# Patient Record
Sex: Female | Born: 1955 | Race: Black or African American | Hispanic: No | Smoking: Current every day smoker
Health system: Southern US, Community
[De-identification: ages and names within clinical notes are randomized; demographics above are authoritative.]

## PROBLEM LIST (undated history)

## (undated) DIAGNOSIS — F1010 Alcohol abuse, uncomplicated: Secondary | ICD-10-CM

## (undated) DIAGNOSIS — I10 Essential (primary) hypertension: Secondary | ICD-10-CM

## (undated) DIAGNOSIS — F319 Bipolar disorder, unspecified: Secondary | ICD-10-CM

## (undated) DIAGNOSIS — D649 Anemia, unspecified: Secondary | ICD-10-CM

## (undated) DIAGNOSIS — E7800 Pure hypercholesterolemia, unspecified: Secondary | ICD-10-CM

## (undated) DIAGNOSIS — M810 Age-related osteoporosis without current pathological fracture: Secondary | ICD-10-CM

## (undated) DIAGNOSIS — E119 Type 2 diabetes mellitus without complications: Secondary | ICD-10-CM

---

## 2016-02-06 ENCOUNTER — Emergency Department (HOSPITAL_BASED_OUTPATIENT_CLINIC_OR_DEPARTMENT_OTHER): Payer: Medicare HMO

## 2016-02-06 ENCOUNTER — Encounter (HOSPITAL_BASED_OUTPATIENT_CLINIC_OR_DEPARTMENT_OTHER): Payer: Self-pay | Admitting: Emergency Medicine

## 2016-02-06 ENCOUNTER — Other Ambulatory Visit: Payer: Self-pay

## 2016-02-06 ENCOUNTER — Emergency Department (HOSPITAL_BASED_OUTPATIENT_CLINIC_OR_DEPARTMENT_OTHER)
Admission: EM | Admit: 2016-02-06 | Discharge: 2016-02-06 | Disposition: A | Discharge status: Home / Self Care | Payer: Medicare HMO | Attending: Emergency Medicine | Admitting: Emergency Medicine

## 2016-02-06 DIAGNOSIS — Z7982 Long term (current) use of aspirin: Secondary | ICD-10-CM | POA: Diagnosis not present

## 2016-02-06 DIAGNOSIS — F17200 Nicotine dependence, unspecified, uncomplicated: Secondary | ICD-10-CM | POA: Insufficient documentation

## 2016-02-06 DIAGNOSIS — R51 Headache: Secondary | ICD-10-CM | POA: Diagnosis present

## 2016-02-06 DIAGNOSIS — R519 Headache, unspecified: Secondary | ICD-10-CM

## 2016-02-06 DIAGNOSIS — Z7984 Long term (current) use of oral hypoglycemic drugs: Secondary | ICD-10-CM | POA: Insufficient documentation

## 2016-02-06 DIAGNOSIS — Z79899 Other long term (current) drug therapy: Secondary | ICD-10-CM | POA: Diagnosis not present

## 2016-02-06 DIAGNOSIS — I10 Essential (primary) hypertension: Secondary | ICD-10-CM | POA: Diagnosis not present

## 2016-02-06 DIAGNOSIS — E119 Type 2 diabetes mellitus without complications: Secondary | ICD-10-CM | POA: Insufficient documentation

## 2016-02-06 DIAGNOSIS — R079 Chest pain, unspecified: Secondary | ICD-10-CM | POA: Diagnosis not present

## 2016-02-06 DIAGNOSIS — F319 Bipolar disorder, unspecified: Secondary | ICD-10-CM | POA: Insufficient documentation

## 2016-02-06 DIAGNOSIS — M542 Cervicalgia: Secondary | ICD-10-CM | POA: Diagnosis not present

## 2016-02-06 HISTORY — DX: Essential (primary) hypertension: I10

## 2016-02-06 HISTORY — DX: Bipolar disorder, unspecified: F31.9

## 2016-02-06 HISTORY — DX: Type 2 diabetes mellitus without complications: E11.9

## 2016-02-06 HISTORY — DX: Anemia, unspecified: D64.9

## 2016-02-06 HISTORY — DX: Pure hypercholesterolemia, unspecified: E78.00

## 2016-02-06 HISTORY — DX: Age-related osteoporosis without current pathological fracture: M81.0

## 2016-02-06 LAB — BASIC METABOLIC PANEL
ANION GAP: 11 (ref 5–15)
BUN: 7 mg/dL (ref 6–20)
CHLORIDE: 97 mmol/L — AB (ref 101–111)
CO2: 31 mmol/L (ref 22–32)
Calcium: 9.6 mg/dL (ref 8.9–10.3)
Creatinine, Ser: 0.66 mg/dL (ref 0.44–1.00)
GFR calc Af Amer: >60 mL/min (ref >60)
GLUCOSE: 104 mg/dL — AB (ref 65–99)
POTASSIUM: 2.3 mmol/L — AB (ref 3.5–5.1)
Sodium: 139 mmol/L (ref 135–145)

## 2016-02-06 LAB — CBC WITH DIFFERENTIAL/PLATELET
BASOS ABS: 0 10*3/uL (ref 0.0–0.1)
BASOS PCT: 0 %
EOS PCT: 2 %
Eosinophils Absolute: 0.1 10*3/uL (ref 0.0–0.7)
HCT: 41.7 % (ref 36.0–46.0)
Hemoglobin: 14.6 g/dL (ref 12.0–15.0)
Lymphocytes Relative: 26 %
Lymphs Abs: 1.7 10*3/uL (ref 0.7–4.0)
MCH: 37.2 pg — ABNORMAL HIGH (ref 26.0–34.0)
MCHC: 35 g/dL (ref 30.0–36.0)
MCV: 106.1 fL — ABNORMAL HIGH (ref 78.0–100.0)
MONO ABS: 0.7 10*3/uL (ref 0.1–1.0)
Monocytes Relative: 11 %
Neutro Abs: 4 10*3/uL (ref 1.7–7.7)
Neutrophils Relative %: 61 %
PLATELETS: 138 10*3/uL — AB (ref 150–400)
RBC: 3.93 MIL/uL (ref 3.87–5.11)
RDW: 12.4 % (ref 11.5–15.5)
WBC: 6.6 10*3/uL (ref 4.0–10.5)

## 2016-02-06 LAB — TROPONIN I

## 2016-02-06 MED ORDER — OXYCODONE-ACETAMINOPHEN 5-325 MG PO TABS
1.0000 | ORAL_TABLET | Freq: Once | ORAL | Status: AC
  Administered 2016-02-06: 1 via ORAL
  Filled 2016-02-06: qty 1

## 2016-02-06 MED ORDER — ONDANSETRON HCL 4 MG/2ML IJ SOLN
4.0000 mg | Freq: Once | INTRAMUSCULAR | Status: AC
  Administered 2016-02-06: 4 mg via INTRAVENOUS
  Filled 2016-02-06: qty 2

## 2016-02-06 MED ORDER — POTASSIUM CHLORIDE ER 10 MEQ PO TBCR: 10.0000 meq | EXTENDED_RELEASE_TABLET | Freq: Two times a day (BID) | ORAL | Status: DC

## 2016-02-06 MED ORDER — SODIUM CHLORIDE 0.9 % IV BOLUS (SEPSIS)
1000.0000 mL | Freq: Once | INTRAVENOUS | Status: AC
  Administered 2016-02-06: 1000 mL via INTRAVENOUS

## 2016-02-06 MED ORDER — METHOCARBAMOL 500 MG PO TABS
500.0000 mg | ORAL_TABLET | Freq: Once | ORAL | Status: AC
  Administered 2016-02-06: 500 mg via ORAL
  Filled 2016-02-06: qty 1

## 2016-02-06 MED ORDER — POTASSIUM CHLORIDE 10 MEQ/100ML IV SOLN
10.0000 meq | Freq: Once | INTRAVENOUS | Status: AC
  Administered 2016-02-06: 10 meq via INTRAVENOUS
  Filled 2016-02-06: qty 100

## 2016-02-06 MED ORDER — POTASSIUM CHLORIDE CRYS ER 20 MEQ PO TBCR
40.0000 meq | EXTENDED_RELEASE_TABLET | Freq: Once | ORAL | Status: AC
Start: 2016-02-06 — End: 2016-02-06
  Administered 2016-02-06: 40 meq via ORAL
  Filled 2016-02-06: qty 2

## 2016-02-06 MED ORDER — METHOCARBAMOL 500 MG PO TABS: 500.0000 mg | ORAL_TABLET | Freq: Two times a day (BID) | ORAL | Status: DC

## 2016-02-06 MED ORDER — NAPROXEN 500 MG PO TABS: 500.0000 mg | ORAL_TABLET | Freq: Two times a day (BID) | ORAL | Status: DC

## 2016-02-06 NOTE — ED Notes (Signed)
Patient is refusing all blood work and IV - states that she is scared of needles. The PA made aware and to talk to the patient

## 2016-02-06 NOTE — Discharge Instructions (Signed)
Take your medication as prescribed as needed for pain relief. I also recommend resting and applying ice and/or heat to affected area for 15-20 minutes 3-4 times daily as an for pain. I recommend refrain from doing any heavy lifting or straining for the next few days until your pain has improved. Follow-up with your primary care provider in the next 2-3 days for further management and evaluation of your neck pain. Return to emergency department if symptoms worsen or new onset of fever, neck stiffness, visual changes, photophobia, chest pain, difficulty breathing, abdominal pain, vomiting, urinary symptoms, numbness, tingling, weakness, seizures, syncope.

## 2016-02-06 NOTE — ED Provider Notes (Signed)
CSN: [id number]     Arrival date & time 02/06/16  0900 History   First MD Initiated Contact with Patient 02/06/16 [phone]     Chief Complaint  Patient presents with  . Headache     (Consider location/radiation/quality/duration/timing/severity/associated sxs/prior Treatment) HPI   Patient is a 60 year old female past medical history of hypertension who presents to the ED with complaint of headache, neck pain, chest pain. Patient reports she has had constant pain for the past 2 months. She reports having a sharp aching pain to her upper chest that radiates up to her neck anteriorly and posteriorly and up to her entire head. Patient denies any aggravating or alleviating factors. Endorses associated neck stiffness and intermittent blurred vision and lightheadedness. Patient also reports having nasal congestion and rhinorrhea for the past few months. Pt denies fever, photophobia, abdominal pain, N/V, urinary symptoms, numbness, tingling, weakness, seizures, syncope.  Patient reports she has been taking ibuprofen at home without relief. She notes she was seen by her PCP regarding the symptoms, reports having labs and x-rays performed which were unremarkable. She states she was advised to come to the ED for further evaluation requesting an EKG and head CT. Pt denies any recent fall, injury or trauma.   Past Medical History  Diagnosis Date  . Hypertension   . Diabetes mellitus without complication (HCC)   . Anemia   . Osteoporosis   . Bipolar 1 disorder (HCC)   . Hypercholesteremia    History reviewed. No pertinent past surgical history. History reviewed. No pertinent family history. Social History  Substance Use Topics  . Smoking status: Current Every Day Smoker  . Smokeless tobacco: None  . Alcohol Use: No   OB History    No data available     Review of Systems  HENT: Positive for congestion and rhinorrhea.   Eyes: Positive for visual disturbance (blurred vision).  Cardiovascular:  Positive for chest pain.  Musculoskeletal: Positive for neck pain and neck stiffness.  Neurological: Positive for light-headedness and headaches.  All other systems reviewed and are negative.     Allergies  Penicillins  Home Medications   Prior to Admission medications   Medication Sig Start Date End Date Taking? Authorizing Provider  aspirin EC 81 MG tablet Take 81 mg by mouth daily.   Yes Historical Provider, MD  hydrochlorothiazide (HYDRODIURIL) 25 MG tablet Take 25 mg by mouth daily.   Yes Historical Provider, MD  lisinopril (PRINIVIL,ZESTRIL) 10 MG tablet Take 10 mg by mouth daily.   Yes Historical Provider, MD  lurasidone (LATUDA) 20 MG TABS tablet Take 20 mg by mouth.   Yes Historical Provider, MD  methocarbamol (ROBAXIN) 500 MG tablet Take 1 tablet (500 mg total) by mouth 2 (two) times daily. 02/06/16   Barrett HenleNicole Elizabeth Ivone Licht, PA-C  naproxen (NAPROSYN) 500 MG tablet Take 1 tablet (500 mg total) by mouth 2 (two) times daily. 02/06/16   Barrett HenleNicole Elizabeth Jannice Beitzel, PA-C  paliperidone (INVEGA) 6 MG 24 hr tablet Take 12 mg by mouth daily.   Yes Historical Provider, MD  potassium chloride (K-DUR) 10 MEQ tablet Take 1 tablet (10 mEq total) by mouth 2 (two) times daily. Take your medication for the next 3 days until you have your blood rechecked by your family doctor. 02/06/16   Barrett HenleNicole Elizabeth Nykerria Macconnell, PA-C  sertraline (ZOLOFT) 50 MG tablet Take 50 mg by mouth daily.   Yes Historical Provider, MD   BP 105/81 mmHg  Pulse 80  Temp(Src) 98.4 F (36.9 C) (Oral)  Resp 18  Ht 5\' 2"  (1.575 m)  Wt 58.968 kg  BMI 23.77 kg/m2  SpO2 98% Physical Exam  Constitutional: She is oriented to person, place, and time. She appears well-developed and well-nourished. No distress.  HENT:  Head: Normocephalic and atraumatic.  Right Ear: Tympanic membrane normal.  Left Ear: Tympanic membrane normal.  Nose: Nose normal. Right sinus exhibits no maxillary sinus tenderness and no frontal sinus tenderness.  Left sinus exhibits no maxillary sinus tenderness and no frontal sinus tenderness.  Mouth/Throat: Uvula is midline, oropharynx is clear and moist and mucous membranes are normal. No oropharyngeal exudate, posterior oropharyngeal edema, posterior oropharyngeal erythema or tonsillar abscesses.  Eyes: Conjunctivae and EOM are normal. Pupils are equal, round, and reactive to light. Right eye exhibits no discharge. Left eye exhibits no discharge. No scleral icterus.  Neck: Neck supple. No Kernig's sign noted.  Dec ROM of neck due to reported pain   Cardiovascular: Normal rate, regular rhythm, normal heart sounds and intact distal pulses.   Pulmonary/Chest: Effort normal and breath sounds normal. No respiratory distress. She has no wheezes. She has no rales. She exhibits no tenderness.  Abdominal: Soft. Bowel sounds are normal. She exhibits no distension and no mass. There is no tenderness. There is no rebound and no guarding.  Musculoskeletal: Normal range of motion. She exhibits no edema.  Lymphadenopathy:    She has no cervical adenopathy.  Neurological: She is alert and oriented to person, place, and time. She has normal strength. No cranial nerve deficit or sensory deficit. She displays a negative Romberg sign. Coordination normal.  Skin: Skin is warm and dry. She is not diaphoretic.  Nursing note and vitals reviewed.   ED Course  Procedures (including critical care time) Labs Review Labs Reviewed  CBC WITH DIFFERENTIAL/PLATELET - Abnormal; Notable for the following:    MCV 106.1 (*)    MCH 37.2 (*)    Platelets 138 (*)    All other components within normal limits  BASIC METABOLIC PANEL - Abnormal; Notable for the following:    Potassium 2.3 (*)    Chloride 97 (*)    Glucose, Bld 104 (*)    All other components within normal limits  TROPONIN I    Imaging Review Dg Chest 2 View  02/06/2016  CLINICAL DATA:  Headache, shortness of breath, cough and upper chest pain. EXAM: CHEST  2 VIEW  COMPARISON:  Chest x-ray on 10/16/2013 at [facility] FINDINGS: The heart size and mediastinal contours are within normal limits. There is no evidence of pulmonary edema, consolidation, pneumothorax, nodule or pleural fluid. The visualized skeletal structures are unremarkable. IMPRESSION: No active cardiopulmonary disease. Electronically Signed   By: Irish LackGlenn  Yamagata M.D.   On: 02/06/2016 10:41   Ct Head Wo Contrast  02/06/2016  CLINICAL DATA:  Neck pain radiating into the head. EXAM: CT HEAD WITHOUT CONTRAST TECHNIQUE: Contiguous axial images were obtained from the base of the skull through the vertex without intravenous contrast. COMPARISON:  02/10/2014 FINDINGS: 11 mm rounded focus of decreased attenuation in the high right parietal region on axial image 25 reflects volume averaging through a sulcus. There is no evidence of acute cortical infarct, intracranial hemorrhage, mass, midline shift, or extra-axial fluid collection. Ventricles and sulci are within normal limits for age. Orbits are unremarkable. Bilateral maxillary sinus mucosal thickening is partially visualized. The mastoid air cells are clear. Mild carotid siphon atherosclerosis is noted bilaterally. No acute osseous abnormality is seen. IMPRESSION: No evidence of acute intracranial  abnormality. Electronically Signed   By: Sebastian Ache M.D.   On: 02/06/2016 10:34   I have personally reviewed and evaluated these images and lab results as part of my medical decision-making.   EKG Interpretation   Date/Time:  Sunday February 06 2016 10:51:51 EDT Ventricular Rate:  59 PR Interval:    QRS Duration: 84 QT Interval:  443 QTC Calculation: 439 R Axis:   80 Text Interpretation:  Sinus rhythm Borderline short PR interval RSR' in V1  or V2, probably normal variant No old tracing to compare Confirmed by  BELFI  MD, MELANIE ([id number]) on 02/06/2016 11:34:31 AM      MDM   Final diagnoses:  Neck pain  Nonintractable headache, unspecified  chronicity pattern, unspecified headache type  Chest pain, unspecified chest pain type   Patient presents with neck pain with associated neck stiffness, headache and chest pain with intermittent lightheadedness and blurred vision. She reports having symptoms for the past 2 months. She notes she has been seen by her PCP twice over the past few months and was advised that she needs to have an EKG and CT head performed for further evaluation but notes her PCP suspected her symptoms were likely due to cervical spine arthritis. Denies any recent fall, injury or head injury. History of hypertension. VSS. Exam revealed decreased range of motion of neck due to reported pain, no meningeal signs. Remaining exam unremarkable. No neuro deficits. Patient given IV fluids and pain meds and muscle relaxant.  Labs showed potassium 2.3, pt given IV and PO potassium replacement in the ED. remaining labs unremarkable. Troponin negative. Chest x-ray negative. EKG showed sinus rhythm, no QTc prolongation, no old tracing to compare. CT head negative. I have a low suspicion for ACS, PE, dissection, or other acute cardiac event at this time. I suspect patient's neck pain/stiffness are likely due to musculoskeletal etiology, likely muscle strain/spasm. I do not suspect meningitis at this time. On reevaluation, patient reports her neck pain has mildly improved. Discussed results and plan for discharge with patient. Advised patient to follow up with her PCP regarding further management of her chronic neck pain. Plan to discharge patient home with symptomatic treatment including NSAIDs and muscle relaxant. Discussed return precautions with patient.  Patients blood pressure 142/103 on initial evaluation. Patient reports history of hypertension and denies taking her medications this morning. On reevaluation patient denies headache, dizziness, shortness of breath, chest pain, abdominal pain, numbness, tingling, weakness. No signs of  hypertensive emergency or urgency at this time. Advised patient to take her medications after being discharged from the ED today. Advised patient to follow up with her PCP in one week to have her blood pressure rechecked. BP 121/85 prior to d/c.  Satira Sark [place], [place] 02/06/16 1641  Rolan Bucco, MD 02/24/16 1104

## 2016-02-06 NOTE — ED Notes (Signed)
Pt still reports neck pain and unable to turn head from side to side.

## 2016-02-06 NOTE — ED Notes (Signed)
Patient states that she has had been to her dr x 2 for this same. They have done multiple tests and all negative. The patient reports that she was sent her because her x- rays were negative and the dr now wants and EKG and a CT scan of her head. The patient reports that she is having neck pain that radiates up into her head. She states that she feels like her head is going to explode

## 2016-05-14 ENCOUNTER — Encounter (HOSPITAL_BASED_OUTPATIENT_CLINIC_OR_DEPARTMENT_OTHER): Payer: Self-pay | Admitting: Emergency Medicine

## 2016-05-14 ENCOUNTER — Emergency Department (HOSPITAL_BASED_OUTPATIENT_CLINIC_OR_DEPARTMENT_OTHER)
Admission: EM | Admit: 2016-05-14 | Discharge: 2016-05-14 | Disposition: A | Discharge status: Home / Self Care | Payer: Medicare HMO | Attending: Emergency Medicine | Admitting: Emergency Medicine

## 2016-05-14 ENCOUNTER — Emergency Department (HOSPITAL_BASED_OUTPATIENT_CLINIC_OR_DEPARTMENT_OTHER): Payer: Medicare HMO

## 2016-05-14 DIAGNOSIS — E119 Type 2 diabetes mellitus without complications: Secondary | ICD-10-CM | POA: Diagnosis not present

## 2016-05-14 DIAGNOSIS — F17210 Nicotine dependence, cigarettes, uncomplicated: Secondary | ICD-10-CM | POA: Insufficient documentation

## 2016-05-14 DIAGNOSIS — Y929 Unspecified place or not applicable: Secondary | ICD-10-CM | POA: Insufficient documentation

## 2016-05-14 DIAGNOSIS — Y999 Unspecified external cause status: Secondary | ICD-10-CM | POA: Diagnosis not present

## 2016-05-14 DIAGNOSIS — X58XXXA Exposure to other specified factors, initial encounter: Secondary | ICD-10-CM | POA: Diagnosis not present

## 2016-05-14 DIAGNOSIS — T18108A Unspecified foreign body in esophagus causing other injury, initial encounter: Secondary | ICD-10-CM | POA: Insufficient documentation

## 2016-05-14 DIAGNOSIS — Y939 Activity, unspecified: Secondary | ICD-10-CM | POA: Diagnosis not present

## 2016-05-14 DIAGNOSIS — I10 Essential (primary) hypertension: Secondary | ICD-10-CM | POA: Insufficient documentation

## 2016-05-14 DIAGNOSIS — Z79899 Other long term (current) drug therapy: Secondary | ICD-10-CM | POA: Diagnosis not present

## 2016-05-14 DIAGNOSIS — Z7982 Long term (current) use of aspirin: Secondary | ICD-10-CM | POA: Insufficient documentation

## 2016-05-14 LAB — CBC WITH DIFFERENTIAL/PLATELET
BASOS PCT: 0 %
Basophils Absolute: 0 10*3/uL (ref 0.0–0.1)
EOS PCT: 1 %
Eosinophils Absolute: 0 10*3/uL (ref 0.0–0.7)
HCT: 39.6 % (ref 36.0–46.0)
HEMOGLOBIN: 14.6 g/dL (ref 12.0–15.0)
Lymphocytes Relative: 23 %
Lymphs Abs: 1.8 10*3/uL (ref 0.7–4.0)
MCH: 37.5 pg — AB (ref 26.0–34.0)
MCHC: 36.9 g/dL — AB (ref 30.0–36.0)
MCV: 101.8 fL — ABNORMAL HIGH (ref 78.0–100.0)
MONO ABS: 0.8 10*3/uL (ref 0.1–1.0)
MONOS PCT: 11 %
NEUTROS ABS: 4.9 10*3/uL (ref 1.7–7.7)
Neutrophils Relative %: 66 %
PLATELETS: 151 10*3/uL (ref 150–400)
RBC: 3.89 MIL/uL (ref 3.87–5.11)
RDW: 13.3 % (ref 11.5–15.5)
WBC: 7.5 10*3/uL (ref 4.0–10.5)

## 2016-05-14 LAB — BASIC METABOLIC PANEL
ANION GAP: 13 (ref 5–15)
BUN: 23 mg/dL — AB (ref 6–20)
CALCIUM: 10.3 mg/dL (ref 8.9–10.3)
CO2: 26 mmol/L (ref 22–32)
Chloride: 92 mmol/L — ABNORMAL LOW (ref 101–111)
Creatinine, Ser: 1.54 mg/dL — ABNORMAL HIGH (ref 0.44–1.00)
GFR calc Af Amer: 42 mL/min — ABNORMAL LOW (ref >60)
GFR, EST NON AFRICAN AMERICAN: 36 mL/min — AB (ref >60)
GLUCOSE: 105 mg/dL — AB (ref 65–99)
Potassium: 3.5 mmol/L (ref 3.5–5.1)
Sodium: 131 mmol/L — ABNORMAL LOW (ref 135–145)

## 2016-05-14 MED ORDER — ONDANSETRON HCL 4 MG/2ML IJ SOLN
4.0000 mg | Freq: Once | INTRAMUSCULAR | Status: AC
  Administered 2016-05-14: 4 mg via INTRAVENOUS
  Filled 2016-05-14: qty 2

## 2016-05-14 MED ORDER — OMEPRAZOLE 20 MG PO CPDR: 20.0000 mg | DELAYED_RELEASE_CAPSULE | Freq: Two times a day (BID) | ORAL | 0 refills | Status: DC

## 2016-05-14 MED ORDER — ONDANSETRON 4 MG PO TBDP: ORAL_TABLET | ORAL | 0 refills | Status: DC

## 2016-05-14 MED ORDER — POTASSIUM CHLORIDE 10 MEQ/100ML IV SOLN: 10.0000 meq | INTRAVENOUS | Status: DC

## 2016-05-14 MED ORDER — MORPHINE SULFATE (PF) 4 MG/ML IV SOLN
4.0000 mg | Freq: Once | INTRAVENOUS | Status: AC
  Administered 2016-05-14: 4 mg via INTRAVENOUS
  Filled 2016-05-14: qty 1

## 2016-05-14 NOTE — ED Triage Notes (Signed)
Patient denies a diagnosis of diabetes. Would like to be checked for that as well.

## 2016-05-14 NOTE — ED Notes (Signed)
Pt given some coke to drink

## 2016-05-14 NOTE — ED Notes (Signed)
Pt stating she can't drink anything as she is sipping on her coke. Pt has drank approx 3 oz without spitting up or vomiting. Will continue to assess.

## 2016-05-14 NOTE — ED Notes (Signed)
Pt not coughing and gagging. Pt has drank nearly 6oz of coke. Pt has spit up some clear saliva into the bag, but the coke has stayed down. MD notified of pt's status.

## 2016-05-14 NOTE — ED Triage Notes (Signed)
Patient reports having a potassium pill stuck in her throat since Friday. Patient is able to drink with a straw, states she can not swallow solids. Patient also c/o a sore to her left foot.

## 2016-05-14 NOTE — ED Provider Notes (Signed)
[facility] Provider Note   CSN: [id number] Arrival date & time: 05/14/16  1636   By signing my name below, I, Clarisse GougeXavier Herndon, attest that this documentation has been prepared under the direction and in the presence of Rolan BuccoMelanie Raelene Trew, MD. Electronically signed, Clarisse GougeXavier Herndon, ED Scribe. 05/14/16. 6:53 PM.   History   Chief Complaint Chief Complaint  Patient presents with  . pill in throat    since friday  . Foot Pain    left  . concern for diabetes  The history is provided by the patient. No language interpreter was used.   HPI Comments: Sharon FlatterySarah Moses is a 60 y.o. female who presents to the Emergency Department complaining of constant, gradual worsening, moderate foreign body in throat x 3 days. Pt reports she has a potassium pill stuck in her throat sideways. She is able to drink fluids, but notes mild discomfort and sometimes vomiting after. She also notes diarrhea today secondary to drinking excess coffee. She reports drinking coffee to dissolve the pill with no relief. Pt says she has had a similar problem in the past but the problem went away after drinking hot fluids.  She is not able to eat solid foods.  She has recently been treated for She does not currently have a GI doctor. No other symptoms noted.  Past Medical History:  Diagnosis Date  . Anemia   . Bipolar 1 disorder (HCC)   . Diabetes mellitus without complication (HCC)   . Hypercholesteremia   . Hypertension   . Osteoporosis     There are no active problems to display for this patient.   History reviewed. No pertinent surgical history.  OB History    No data available       Home Medications    Prior to Admission medications   Medication Sig Start Date End Date Taking? Authorizing Provider  aspirin EC 81 MG tablet Take 81 mg by mouth daily.   Yes Historical Provider, MD  hydrochlorothiazide (HYDRODIURIL) 25 MG tablet Take 25 mg by mouth daily.   Yes Historical Provider, MD  lisinopril  (PRINIVIL,ZESTRIL) 10 MG tablet Take 10 mg by mouth daily.   Yes Historical Provider, MD  lurasidone (LATUDA) 20 MG TABS tablet Take 20 mg by mouth.   Yes Historical Provider, MD  methocarbamol (ROBAXIN) 500 MG tablet Take 1 tablet (500 mg total) by mouth 2 (two) times daily. 02/06/16  Yes Barrett HenleNicole Elizabeth Nadeau, PA-C  naproxen (NAPROSYN) 500 MG tablet Take 1 tablet (500 mg total) by mouth 2 (two) times daily. 02/06/16  Yes Barrett HenleNicole Elizabeth Nadeau, PA-C  paliperidone (INVEGA) 6 MG 24 hr tablet Take 12 mg by mouth daily.   Yes Historical Provider, MD  potassium chloride (K-DUR) 10 MEQ tablet Take 1 tablet (10 mEq total) by mouth 2 (two) times daily. Take your medication for the next 3 days until you have your blood rechecked by your family doctor. 02/06/16  Yes Barrett HenleNicole Elizabeth Nadeau, PA-C  sertraline (ZOLOFT) 50 MG tablet Take 50 mg by mouth daily.   Yes Historical Provider, MD  omeprazole (PRILOSEC) 20 MG capsule Take 1 capsule (20 mg total) by mouth 2 (two) times daily before a meal. 05/14/16   Rolan BuccoMelanie Hyman Crossan, MD  ondansetron (ZOFRAN ODT) 4 MG disintegrating tablet 4mg  ODT q4 hours prn nausea/vomit 05/14/16   Rolan BuccoMelanie Adaia Matthies, MD    Family History History reviewed. No pertinent family history.  Social History Social History  Substance Use Topics  . Smoking status: Current Every Day  Smoker    Packs/day: 1.00    Types: Cigarettes  . Smokeless tobacco: Never Used  . Alcohol use No     Allergies   Penicillins   Review of Systems Review of Systems  Constitutional: Negative for chills, diaphoresis, fatigue and fever.  HENT: Positive for sore throat and trouble swallowing. Negative for congestion, mouth sores, rhinorrhea and sneezing.   Eyes: Negative.   Respiratory: Negative for cough, chest tightness and shortness of breath.   Cardiovascular: Negative for chest pain and leg swelling.  Gastrointestinal: Positive for diarrhea, nausea and vomiting. Negative for abdominal pain and blood in  stool.  Genitourinary: Negative for difficulty urinating, flank pain, frequency and hematuria.  Musculoskeletal: Negative for arthralgias and back pain.  Skin: Negative for rash.  Neurological: Negative for dizziness, speech difficulty, weakness, numbness and headaches.     Physical Exam Updated Vital Signs BP 108/65 (BP Location: Left Arm)   Pulse 85   Temp 98.4 F (36.9 C) (Oral)   Resp 21   Ht 5\' 2"  (1.575 m)   Wt 130 lb (59 kg)   SpO2 (!) 83%   BMI 23.78 kg/m   Physical Exam  Constitutional: She is oriented to person, place, and time. She appears well-developed and well-nourished. No distress.  HENT:  Head: Normocephalic and atraumatic.  Eyes: Conjunctivae are normal. Pupils are equal, round, and reactive to light.  Neck: Normal range of motion. Neck supple.  Cardiovascular: Normal rate, regular rhythm and normal heart sounds.   No murmur heard. Pulmonary/Chest: Effort normal and breath sounds normal. No respiratory distress. She has no wheezes. She has no rales. She exhibits no tenderness.  Abdominal: Soft. Bowel sounds are normal. There is no tenderness. There is no rebound and no guarding.  Musculoskeletal: Normal range of motion. She exhibits no edema.  Lymphadenopathy:    She has no cervical adenopathy.  Neurological: She is alert and oriented to person, place, and time.  Skin: Skin is warm and dry. No rash noted.  Psychiatric: She has a normal mood and affect.  Nursing note and vitals reviewed.    ED Treatments / Results  DIAGNOSTIC STUDIES: Oxygen Saturation is 100% on RA, normal by my interpretation.    COORDINATION OF CARE: 6:53 PM Will order xR, fluids for pt to drink. Discussed treatment plan with pt at bedside and pt agreed to plan.    Labs (all labs ordered are listed, but only abnormal results are displayed) Labs Reviewed  BASIC METABOLIC PANEL - Abnormal; Notable for the following:       Result Value   Sodium 131 (*)    Chloride 92 (*)     Glucose, Bld 105 (*)    BUN 23 (*)    Creatinine, Ser 1.54 (*)    GFR calc non Af Amer 36 (*)    GFR calc Af Amer 42 (*)    All other components within normal limits  CBC WITH DIFFERENTIAL/PLATELET - Abnormal; Notable for the following:    MCV 101.8 (*)    MCH 37.5 (*)    MCHC 36.9 (*)    All other components within normal limits    EKG  EKG Interpretation None       Radiology Dg Neck Soft Tissue  Result Date: 05/14/2016 CLINICAL DATA:  Difficulty eating or drinking for 2 days, initial encounter, possible foreign body EXAM: NECK SOFT TISSUES - 1+ VIEW COMPARISON:  None. FINDINGS: Degenerative changes of the cervical spine are noted. No prevertebral soft tissue swelling  is seen. No radiopaque foreign body is noted. IMPRESSION: No acute abnormality noted. Electronically Signed   By: Alcide Clever M.D.   On: 05/14/2016 19:48   Dg Chest 2 View  Result Date: 05/14/2016 CLINICAL DATA:  Nausea and vomiting 3 days ago. Unable to keep solids or liquids down. No fever. History of hypertension. EXAM: CHEST  2 VIEW COMPARISON:  02/06/2016 and 10/16/2013. FINDINGS: The heart size and mediastinal contours are normal. The lungs are clear. There is no pleural effusion or pneumothorax. No acute osseous findings are identified. IMPRESSION: Stable chest.  No active cardiopulmonary process. Electronically Signed   By: Carey Bullocks M.D.   On: 05/14/2016 19:49    Procedures Procedures (including critical care time)  Medications Ordered in ED Medications  morphine 4 MG/ML injection 4 mg (not administered)  ondansetron (ZOFRAN) injection 4 mg (not administered)     Initial Impression / Assessment and Plan / ED Course  I have reviewed the triage vital signs and the nursing notes.  Pertinent labs & imaging results that were available during my care of the patient were reviewed by me and considered in my medical decision making (see chart for details).  Clinical Course    Patient presents with  a possible esophageal foreign body. She is able to swallow liquids in the ED without ongoing vomiting. She does spit up a little bit of mucus but does keep down any liquids. She has no lesions in her mouth. There is no abdominal pain. Some of this vomiting started before she swallowed the potassium pill with what sounds like a viral syndrome. She states she's been drinking large volumes of fluid which also may be contributing to the vomiting. I spoke with Dr.Brahmbhatt, with [facility], who feels that it's unlikely that the potassium pill is still in her esophagus. He recommends starting the patient on a PPI and having close follow-up with GI. She was discharged home in good condition. She was given a prescription for Prilosec as well as Zofran ODT tablets. She was encouraged to make a follow up appointment with GI. Return precautions were given.  I personally performed the services described in this documentation, which was scribed in my presence.  The recorded information has been reviewed and considered.  Final Clinical Impressions(s) / ED Diagnoses   Final diagnoses:  Esophageal foreign body, initial encounter    New Prescriptions New Prescriptions   OMEPRAZOLE (PRILOSEC) 20 MG CAPSULE    Take 1 capsule (20 mg total) by mouth 2 (two) times daily before a meal.   ONDANSETRON (ZOFRAN ODT) 4 MG DISINTEGRATING TABLET    4mg  ODT q4 hours prn nausea/vomit     Rolan Bucco, MD 05/14/16 2335

## 2018-09-21 ENCOUNTER — Other Ambulatory Visit: Payer: Self-pay

## 2018-09-21 ENCOUNTER — Encounter (HOSPITAL_BASED_OUTPATIENT_CLINIC_OR_DEPARTMENT_OTHER): Payer: Self-pay | Admitting: Emergency Medicine

## 2018-09-21 ENCOUNTER — Emergency Department (HOSPITAL_BASED_OUTPATIENT_CLINIC_OR_DEPARTMENT_OTHER): Payer: Medicare HMO

## 2018-09-21 ENCOUNTER — Observation Stay (HOSPITAL_BASED_OUTPATIENT_CLINIC_OR_DEPARTMENT_OTHER)
Admission: EM | Admit: 2018-09-21 | Discharge: 2018-09-23 | Disposition: A | Discharge status: Home / Self Care | Payer: Medicare HMO | Attending: Internal Medicine | Admitting: Internal Medicine

## 2018-09-21 DIAGNOSIS — R197 Diarrhea, unspecified: Secondary | ICD-10-CM | POA: Diagnosis present

## 2018-09-21 DIAGNOSIS — Z791 Long term (current) use of non-steroidal anti-inflammatories (NSAID): Secondary | ICD-10-CM | POA: Diagnosis not present

## 2018-09-21 DIAGNOSIS — F319 Bipolar disorder, unspecified: Secondary | ICD-10-CM | POA: Diagnosis not present

## 2018-09-21 DIAGNOSIS — R8271 Bacteriuria: Secondary | ICD-10-CM | POA: Insufficient documentation

## 2018-09-21 DIAGNOSIS — N171 Acute kidney failure with acute cortical necrosis: Secondary | ICD-10-CM | POA: Diagnosis not present

## 2018-09-21 DIAGNOSIS — F17210 Nicotine dependence, cigarettes, uncomplicated: Secondary | ICD-10-CM | POA: Diagnosis not present

## 2018-09-21 DIAGNOSIS — E785 Hyperlipidemia, unspecified: Secondary | ICD-10-CM | POA: Insufficient documentation

## 2018-09-21 DIAGNOSIS — D649 Anemia, unspecified: Secondary | ICD-10-CM | POA: Diagnosis not present

## 2018-09-21 DIAGNOSIS — E7800 Pure hypercholesterolemia, unspecified: Secondary | ICD-10-CM | POA: Diagnosis not present

## 2018-09-21 DIAGNOSIS — E119 Type 2 diabetes mellitus without complications: Secondary | ICD-10-CM | POA: Insufficient documentation

## 2018-09-21 DIAGNOSIS — K219 Gastro-esophageal reflux disease without esophagitis: Secondary | ICD-10-CM | POA: Diagnosis not present

## 2018-09-21 DIAGNOSIS — Z79899 Other long term (current) drug therapy: Secondary | ICD-10-CM | POA: Diagnosis not present

## 2018-09-21 DIAGNOSIS — N179 Acute kidney failure, unspecified: Secondary | ICD-10-CM | POA: Diagnosis not present

## 2018-09-21 DIAGNOSIS — Z7982 Long term (current) use of aspirin: Secondary | ICD-10-CM | POA: Insufficient documentation

## 2018-09-21 DIAGNOSIS — I10 Essential (primary) hypertension: Secondary | ICD-10-CM | POA: Insufficient documentation

## 2018-09-21 DIAGNOSIS — R112 Nausea with vomiting, unspecified: Secondary | ICD-10-CM | POA: Diagnosis present

## 2018-09-21 LAB — COMPREHENSIVE METABOLIC PANEL
ALK PHOS: 50 U/L (ref 38–126)
ALT: 20 U/L (ref 0–44)
AST: 26 U/L (ref 15–41)
Albumin: 4.6 g/dL (ref 3.5–5.0)
Anion gap: 15 (ref 5–15)
BUN: 54 mg/dL — ABNORMAL HIGH (ref 8–23)
CO2: 20 mmol/L — ABNORMAL LOW (ref 22–32)
Calcium: 10.4 mg/dL — ABNORMAL HIGH (ref 8.9–10.3)
Chloride: 99 mmol/L (ref 98–111)
Creatinine, Ser: 6.64 mg/dL — ABNORMAL HIGH (ref 0.44–1.00)
GFR calc non Af Amer: 6 mL/min — ABNORMAL LOW (ref >60)
GFR, EST AFRICAN AMERICAN: 7 mL/min — AB (ref >60)
Glucose, Bld: 182 mg/dL — ABNORMAL HIGH (ref 70–99)
Potassium: 4.1 mmol/L (ref 3.5–5.1)
Sodium: 134 mmol/L — ABNORMAL LOW (ref 135–145)
Total Bilirubin: 0.7 mg/dL (ref 0.3–1.2)
Total Protein: 7.7 g/dL (ref 6.5–8.1)

## 2018-09-21 LAB — CREATININE, SERUM
Creatinine, Ser: 4.56 mg/dL — ABNORMAL HIGH (ref 0.44–1.00)
GFR calc non Af Amer: 10 mL/min — ABNORMAL LOW (ref >60)
GFR, EST AFRICAN AMERICAN: 11 mL/min — AB (ref >60)

## 2018-09-21 LAB — CBC WITH DIFFERENTIAL/PLATELET
Abs Immature Granulocytes: 0.04 10*3/uL (ref 0.00–0.07)
Basophils Absolute: 0 10*3/uL (ref 0.0–0.1)
Basophils Relative: 0 %
EOS PCT: 1 %
Eosinophils Absolute: 0.1 10*3/uL (ref 0.0–0.5)
HCT: 43.1 % (ref 36.0–46.0)
Hemoglobin: 14.8 g/dL (ref 12.0–15.0)
Immature Granulocytes: 0 %
Lymphocytes Relative: 19 %
Lymphs Abs: 2.1 10*3/uL (ref 0.7–4.0)
MCH: 35.4 pg — ABNORMAL HIGH (ref 26.0–34.0)
MCHC: 34.3 g/dL (ref 30.0–36.0)
MCV: 103.1 fL — ABNORMAL HIGH (ref 80.0–100.0)
MONO ABS: 0.7 10*3/uL (ref 0.1–1.0)
Monocytes Relative: 6 %
Neutro Abs: 7.8 10*3/uL — ABNORMAL HIGH (ref 1.7–7.7)
Neutrophils Relative %: 74 %
Platelets: 213 10*3/uL (ref 150–400)
RBC: 4.18 MIL/uL (ref 3.87–5.11)
RDW: 11.9 % (ref 11.5–15.5)
WBC: 10.7 10*3/uL — ABNORMAL HIGH (ref 4.0–10.5)
nRBC: 0 % (ref 0.0–0.2)

## 2018-09-21 LAB — URINALYSIS, ROUTINE W REFLEX MICROSCOPIC
Bilirubin Urine: NEGATIVE
Glucose, UA: NEGATIVE mg/dL
Ketones, ur: NEGATIVE mg/dL
Nitrite: NEGATIVE
Protein, ur: NEGATIVE mg/dL
Specific Gravity, Urine: 1.015 (ref 1.005–1.030)
pH: 6 (ref 5.0–8.0)

## 2018-09-21 LAB — LIPASE, BLOOD: Lipase: 20 U/L (ref 11–51)

## 2018-09-21 LAB — CBC
HCT: 35.2 % — ABNORMAL LOW (ref 36.0–46.0)
Hemoglobin: 12 g/dL (ref 12.0–15.0)
MCH: 34.7 pg — ABNORMAL HIGH (ref 26.0–34.0)
MCHC: 34.1 g/dL (ref 30.0–36.0)
MCV: 101.7 fL — ABNORMAL HIGH (ref 80.0–100.0)
PLATELETS: 158 10*3/uL (ref 150–400)
RBC: 3.46 MIL/uL — AB (ref 3.87–5.11)
RDW: 11.9 % (ref 11.5–15.5)
WBC: 8.2 10*3/uL (ref 4.0–10.5)
nRBC: 0 % (ref 0.0–0.2)

## 2018-09-21 LAB — URINALYSIS, MICROSCOPIC (REFLEX)

## 2018-09-21 MED ORDER — SODIUM CHLORIDE 0.9 % IV SOLN
INTRAVENOUS | Status: AC
  Administered 2018-09-21 – 2018-09-22 (×2): via INTRAVENOUS

## 2018-09-21 MED ORDER — SODIUM CHLORIDE 0.9% FLUSH: 3.0000 mL | INTRAVENOUS | Status: DC | PRN

## 2018-09-21 MED ORDER — ACETAMINOPHEN 325 MG PO TABS: 650.0000 mg | ORAL_TABLET | Freq: Four times a day (QID) | ORAL | Status: DC | PRN

## 2018-09-21 MED ORDER — ONDANSETRON HCL 4 MG/2ML IJ SOLN: 4.0000 mg | Freq: Four times a day (QID) | INTRAMUSCULAR | Status: DC | PRN

## 2018-09-21 MED ORDER — SODIUM CHLORIDE 0.9 % IV BOLUS
1000.0000 mL | Freq: Once | INTRAVENOUS | Status: AC
  Administered 2018-09-21: 1000 mL via INTRAVENOUS

## 2018-09-21 MED ORDER — ONDANSETRON HCL 4 MG/2ML IJ SOLN
4.0000 mg | Freq: Once | INTRAMUSCULAR | Status: AC
  Administered 2018-09-21: 4 mg via INTRAVENOUS
  Filled 2018-09-21: qty 2

## 2018-09-21 MED ORDER — SODIUM CHLORIDE 0.9 % IV SOLN
250.0000 mL | INTRAVENOUS | Status: DC | PRN
  Administered 2018-09-22: 250 mL via INTRAVENOUS

## 2018-09-21 MED ORDER — HEPARIN SODIUM (PORCINE) 5000 UNIT/ML IJ SOLN
5000.0000 [IU] | Freq: Three times a day (TID) | INTRAMUSCULAR | Status: DC
  Administered 2018-09-21 – 2018-09-23 (×5): 5000 [IU] via SUBCUTANEOUS
  Filled 2018-09-21 (×5): qty 1

## 2018-09-21 MED ORDER — SODIUM CHLORIDE 0.9% FLUSH
3.0000 mL | Freq: Two times a day (BID) | INTRAVENOUS | Status: DC
  Administered 2018-09-22 (×2): 3 mL via INTRAVENOUS

## 2018-09-21 MED ORDER — OXYCODONE HCL 5 MG PO TABS
5.0000 mg | ORAL_TABLET | ORAL | Status: DC | PRN
  Administered 2018-09-21 – 2018-09-23 (×2): 5 mg via ORAL
  Filled 2018-09-21 (×2): qty 1

## 2018-09-21 MED ORDER — MORPHINE SULFATE (PF) 4 MG/ML IV SOLN
4.0000 mg | Freq: Once | INTRAVENOUS | Status: AC
  Administered 2018-09-21: 4 mg via INTRAVENOUS
  Filled 2018-09-21: qty 1

## 2018-09-21 MED ORDER — ONDANSETRON HCL 4 MG PO TABS: 4.0000 mg | ORAL_TABLET | Freq: Four times a day (QID) | ORAL | Status: DC | PRN

## 2018-09-21 MED ORDER — POLYETHYLENE GLYCOL 3350 17 G PO PACK: 17.0000 g | PACK | Freq: Every day | ORAL | Status: DC | PRN

## 2018-09-21 MED ORDER — LACTATED RINGERS IV BOLUS
1000.0000 mL | Freq: Once | INTRAVENOUS | Status: AC
  Administered 2018-09-21: 1000 mL via INTRAVENOUS

## 2018-09-21 MED ORDER — ACETAMINOPHEN 650 MG RE SUPP: 650.0000 mg | Freq: Four times a day (QID) | RECTAL | Status: DC | PRN

## 2018-09-21 MED ORDER — TRAZODONE HCL 50 MG PO TABS: 25.0000 mg | ORAL_TABLET | Freq: Every evening | ORAL | Status: DC | PRN

## 2018-09-21 NOTE — H&P (Signed)
History and Physical  Unassigned patient transfer from [facility]   Sharon Moses PXT:[id number] DOB: 1955-11-24 DOA: 09/21/2018  [facility]P: [facility]  Patient coming from: [facility]  I have personally briefly reviewed patient's old medical records in [facility]  Chief Complaint: Vomiting and diarrhea for approximately 5 days  HPI: Sharon Moses is a 63 y.o. female with medical history significant of anemia, bipolar disorder, hypercholesterolemia, and hypertension, (medical record shows a diagnosis of diabetes but patient is on no medications), and osteoporosis who presented to [facility] today with complaints of nausea and vomiting.  She states that 8 days ago she saw her primary care provider at [facility] was told she had a urinary tract infection.  He had no urinary symptoms but her urine appeared abnormal.  She was prescribed something with sulfa she believes it was sulfamethoxazole trimethoprim.  Also received a flu shot.  3 days after starting her medication she developed vomiting and diarrhea.  Has been having diarrhea and vomiting every day since.  She cannot keep fluids down.  She denies any fevers or chills.  She is having diffuse abdominal pain which is worse in her flanks.  Has had no urinary symptoms then or now.  She has no blood in her stools or emesis.  Had 2 episodes of syncope while on the toilet yesterday.  She also developed a diffuse rash especially in her bilateral arms with whelps.  Rash is now gone but she still feels a little itchy.  Went to [facility] yesterday for recheck and had labs drawn.  ED Course: Need found to be in the 6 range which is new compared to 2017.  Referred to [place] for further evaluation and management  Review of Systems: As per HPI otherwise all other systems reviewed and  negative.    Past Medical History:  Diagnosis Date  . Anemia   . Bipolar 1 disorder (HCC)   . Diabetes mellitus without  complication (HCC)   . Hypercholesteremia   . Hypertension   . Osteoporosis     History reviewed. No pertinent surgical history.  Social History   Social History Narrative  . Not on file     reports that she has been smoking cigarettes. She has been smoking about 1.00 pack per day. She has never used smokeless tobacco. She reports that she does not drink alcohol or use drugs.  Allergies  Allergen Reactions  . Penicillins Itching  . Sulfa Antibiotics Itching    History reviewed. No pertinent family history.   Prior to Admission medications   Medication Sig Start Date End Date Taking? Authorizing Provider  aspirin EC 81 MG tablet Take 81 mg by mouth daily.   Yes [provider]  atorvastatin (LIPITOR) 80 MG tablet Take 80 mg by mouth daily. 05/03/18  Yes [provider]  hydrochlorothiazide (HYDRODIURIL) 25 MG tablet Take 25 mg by mouth daily.   Yes [provider]  lisinopril (PRINIVIL,ZESTRIL) 10 MG tablet Take 10 mg by mouth daily.   Yes [provider]  lurasidone (LATUDA) 20 MG TABS tablet Take 20 mg by mouth.   Yes [provider]  potassium chloride (K-DUR) 10 MEQ tablet Take 1 tablet (10 mEq total) by mouth 2 (two) times daily. Take your medication for the next 3 days until you have your blood rechecked by your family doctor. 02/06/16  Yes Barrett Henle, PA-C  sertraline (ZOLOFT) 50 MG tablet Take 50  mg by mouth daily.   Yes [provider]  methocarbamol (ROBAXIN) 500 MG tablet Take 1 tablet (500 mg total) by mouth 2 (two) times daily. Patient not taking: Reported on 09/21/2018 02/06/16   Barrett HenleNadeau, Nicole Elizabeth, PA-C  naproxen (NAPROSYN) 500 MG tablet Take 1 tablet (500 mg total) by mouth 2 (two) times daily. Patient not taking: Reported on 09/21/2018 02/06/16   Barrett HenleNadeau, Nicole Elizabeth, PA-C  omeprazole (PRILOSEC) 20 MG capsule Take 1 capsule (20 mg total) by mouth 2 (two) times daily before a meal. Patient not  taking: Reported on 09/21/2018 05/14/16   Rolan BuccoBelfi, Melanie, MD  ondansetron (ZOFRAN ODT) 4 MG disintegrating tablet 4mg  ODT q4 hours prn nausea/vomit Patient not taking: Reported on 09/21/2018 05/14/16   Rolan BuccoBelfi, Melanie, MD  paliperidone (INVEGA) 6 MG 24 hr tablet Take 12 mg by mouth daily.    [provider]    Physical Exam:  Constitutional: NAD, calm, comfortable, thin Vitals:   09/21/18 1330 09/21/18 1403 09/21/18 1430 09/21/18 1617  BP: (!) 102/59 (!) 109/58 101/80 98/62  Pulse: 85 78 86 72  Resp: 19 (!) 25  20  Temp:    98.7 F (37.1 C)  TempSrc:    Oral  SpO2: 94% 100% 95% 100%  Weight:    65.4 kg  Height:    5\' 2"  (1.575 m)   Eyes: PERRL, lids and conjunctivae normal ENMT: Mucous membranes are moist. Posterior pharynx clear of any exudate or lesions.Normal dentition.  Neck: normal, supple, no masses, no thyromegaly Respiratory: clear to auscultation bilaterally, no wheezing, no crackles. Normal respiratory effort. No accessory muscle use.  Cardiovascular: Regular rate and rhythm, no murmurs / rubs / gallops. No extremity edema. 2+ pedal pulses. No carotid bruits.  Abdomen: no tenderness, no masses palpated. No hepatosplenomegaly. Bowel sounds positive.  Musculoskeletal: no clubbing / cyanosis. No joint deformity upper and lower extremities. Good ROM, no contractures. Normal muscle tone.  Skin: no rashes, lesions, ulcers. No induration Neurologic: CN 2-12 grossly intact. Sensation intact, DTR normal. Strength 5/5 in all 4.  Psychiatric: Normal judgment and insight. Alert and oriented x 3. Normal mood.    Labs on Admission: I have personally reviewed following labs and imaging studies  CBC: Recent Labs  Lab 09/21/18 1103  WBC 10.7*  NEUTROABS 7.8*  HGB 14.8  HCT 43.1  MCV 103.1*  PLT 213   Basic Metabolic Panel: Recent Labs  Lab 09/21/18 1103  NA 134*  K 4.1  CL 99  CO2 20*  GLUCOSE 182*  BUN 54*  CREATININE 6.64*  CALCIUM 10.4*   GFR: Estimated  Creatinine Clearance: 7.9 mL/min (A) (by C-G formula based on SCr of 6.64 mg/dL (H)). Liver Function Tests: Recent Labs  Lab 09/21/18 1103  AST 26  ALT 20  ALKPHOS 50  BILITOT 0.7  PROT 7.7  ALBUMIN 4.6   Recent Labs  Lab 09/21/18 1103  LIPASE 20   No results for input(s): AMMONIA in the last 168 hours. Coagulation Profile: No results for input(s): INR, PROTIME in the last 168 hours. Cardiac Enzymes: No results for input(s): CKTOTAL, CKMB, CKMBINDEX, TROPONINI in the last 168 hours. BNP (last 3 results) No results for input(s): PROBNP in the last 8760 hours. HbA1C: No results for input(s): HGBA1C in the last 72 hours. CBG: No results for input(s): GLUCAP in the last 168 hours. Lipid Profile: No results for input(s): CHOL, HDL, LDLCALC, TRIG, CHOLHDL, LDLDIRECT in the last 72 hours. Thyroid Function Tests: No results for input(s):  TSH, T4TOTAL, FREET4, T3FREE, THYROIDAB in the last 72 hours. Anemia Panel: No results for input(s): VITAMINB12, FOLATE, FERRITIN, TIBC, IRON, RETICCTPCT in the last 72 hours. Urine analysis:    Component Value Date/Time   COLORURINE YELLOW 09/21/2018 1408   APPEARANCEUR CLEAR 09/21/2018 1408   LABSPEC 1.015 09/21/2018 1408   PHURINE 6.0 09/21/2018 1408   GLUCOSEU NEGATIVE 09/21/2018 1408   HGBUR TRACE (A) 09/21/2018 1408   BILIRUBINUR NEGATIVE 09/21/2018 1408   KETONESUR NEGATIVE 09/21/2018 1408   PROTEINUR NEGATIVE 09/21/2018 1408   NITRITE NEGATIVE 09/21/2018 1408   LEUKOCYTESUR TRACE (A) 09/21/2018 1408    Radiological Exams on Admission: Ct Abdomen Pelvis Wo Contrast  Result Date: 09/21/2018 CLINICAL DATA:  Abdominal pain and vomiting EXAM: CT ABDOMEN AND PELVIS WITHOUT CONTRAST TECHNIQUE: Multidetector CT imaging of the abdomen and pelvis was performed following the standard protocol without oral or IV contrast. COMPARISON:  None. FINDINGS: Lower chest: There is patchy infiltrate in the lateral segment of the left lower lobe. There  is mild bibasilar atelectasis as well. Hepatobiliary: No focal liver lesions are appreciable on this noncontrast enhanced study. Gallbladder wall is not appreciably thickened. There is no biliary duct dilatation. Pancreas: There is no pancreatic mass or inflammatory focus. Spleen: No splenic lesions are evident. Adrenals/Urinary Tract: Right adrenal appears normal. There is mild left adrenal hypertrophy. Kidneys bilaterally show no evident mass or hydronephrosis on either side. There is no appreciable renal or ureteral calculus on either side. Urinary bladder is midline with wall thickness within normal limits. Stomach/Bowel: There is no appreciable bowel wall or mesenteric thickening. There is no appreciable bowel obstruction. There is no free air or portal venous air. No evident diverticulitis. Vascular/Lymphatic: There is aortoiliac atherosclerosis. No aneurysm evident. There is no appreciable calcification in major arterial vessels. There is no adenopathy evident in the abdomen or pelvis. Reproductive: The uterus is mildly retropositioned. No pelvic mass is demonstrable. Other: Appendix appears normal. There is no abscess or ascites in the abdomen or pelvis. Musculoskeletal: There is degenerative change in the lower thoracic and lumbar regions. There are no blastic or lytic bone lesions. No intramuscular or abdominal wall lesions are evident. IMPRESSION: 1. Apparent focus of pneumonia in the lateral segment left lower lobe. 2. No evident bowel obstruction. No abscess in the abdomen or pelvis. Appendix appears normal. No evident diverticulitis. 3. No evident renal or ureteral calculus. No hydronephrosis on either side. 4.  Aortoiliac atherosclerosis. 5. Mild left adrenal hypertrophy, a finding of questionable clinical significance. Electronically Signed   By: Bretta Bang III M.D.   On: 09/21/2018 12:44    EKG: Independently reviewed. Sinus rhythm, Consider right atrial enlargement, Consider right  ventricular hypertrophy Artifact, no significant change since 2017  Assessment/Plan Principal Problem:   Acute renal failure (ARF) (HCC) Active Problems:   Essential (primary) hypertension   GERD (gastroesophageal reflux disease)   Bipolar depression (HCC)   1.  Acute renal failure: This is clearly a new problem with a creatinine of 6.64, repeat creatinine this afternoon is 4.56 and has already improved.  She received IV fluids at [facility] which we will continue here.  Appears fairly bland.  There is no report of casts.  I suspect that the patient developed an acute interstitial nephritis due to a previously unknown allergy to sulfa drugs.  We will hydrate the patient recheck creatinine in a.m. and consider nephrology consultation if necessary.  Would avoid nephrotoxic agents, diuretics, and ACE inhibitors.  Review of home  medicines which has not yet been verified suggest that the patient is taking Naprosyn.  If she has been doing that recently this will have to be discontinued.  2.  Essential primary hypertension: Patient takes hydrochlorothiazide, lisinopril at home.  Meds have not yet been verified will await further evaluation by pharmacy prior to starting home medications.  3.  Gastroesophageal reflux disease: Patient on proton pump inhibitor at home once medicines verified will restart.  4.  Bipolar depression: Patient takes Latuda, sertraline.  Once medications verified will restart.  DVT prophylaxis: Subcu heparin Code Status: Full code Family Communication: No family present at the time of mission.  Patient retains capacity. Disposition Plan: Likely home in 3 to 4 days Consults called: None at present Admission status: Inpatient   Lahoma Crockerheresa C Kepler Mccabe MD FACP [facility] Pager [phone]  How to contact the [facility] Attending or Consulting provider 7A - 7P or covering provider during after hours 7P -7A, for this patient?  1. Check the care team in [facility] and look  for a) attending/consulting [facility] provider listed and b) the [facility] team listed 2. Log into [email or web address] and use [place]'s universal password to access. If you do not have the password, please contact the hospital operator. 3. Locate the [facility] provider you are looking for under [facility] and page to a number that you can be directly reached. 4. If you still have difficulty reaching the provider, please page the [facility] (Director on Call) for the Hospitalists listed on amion for assistance.  If 7PM-7AM, please contact night-coverage [email or web address] Password [email or web address]  09/21/2018, 4:38 PM

## 2018-09-21 NOTE — ED Notes (Signed)
[facility] arrived to transport [facility] to [facility] 

## 2018-09-21 NOTE — Plan of Care (Signed)
  Problem: Pain Managment: Goal: General experience of comfort will improve Outcome: Progressing   Problem: Safety: Goal: Ability to remain free from injury will improve Outcome: Progressing   Problem: Skin Integrity: Goal: Risk for impaired skin integrity will decrease Outcome: Progressing   

## 2018-09-21 NOTE — ED Notes (Signed)
Pt given water per request. Family at bedside. Comfort measures provided

## 2018-09-21 NOTE — ED Triage Notes (Signed)
Pt states she started on an abx for UTI and then began having a rash, vomiting and diarrhea.

## 2018-09-21 NOTE — Progress Notes (Signed)
[facility] admit made aware of the admission

## 2018-09-21 NOTE — ED Provider Notes (Signed)
[facility] EMERGENCY DEPARTMENT Provider Note   CSN: [id number] Arrival date & time: 09/21/18  1034     History   Chief Complaint Chief Complaint  Patient presents with  . Medication Reaction    HPI Jeanella FlatterySarah Widmayer is a 63 y.o. female.  HPI  63 year old female presents with a chief complaint of vomiting and diarrhea.  She states that 8 days ago she saw her PCP and was told she had a UTI.  She had no symptoms at that time but her urine appeared like a UTI per patient.  Was prescribed something with sulfa.  Was also given a flu shot.  Next day developed vomiting and diarrhea.  She is been having this every day since.  She cannot keep fluids down.  No fevers.  She is having diffuse abdominal pain, worst in her flanks.  She denies any urinary symptoms then or now.  No blood in her stools or emesis.  She had 2 syncopal episodes while on the toilet yesterday.  She also noticed a diffuse rash, especially in her bilateral arms with "whelps".  The rash is gone but she still feels a little itchy.  [facility] and had labs drawn.  Past Medical History:  Diagnosis Date  . Anemia   . Bipolar 1 disorder (HCC)   . Diabetes mellitus without complication (HCC)   . Hypercholesteremia   . Hypertension   . Osteoporosis     Patient Active Problem List   Diagnosis Date Noted  . Acute renal failure (ARF) (HCC) 09/21/2018    History reviewed. No pertinent surgical history.   OB History   No obstetric history on file.      Home Medications    Prior to Admission medications   Medication Sig Start Date End Date Taking? Authorizing Provider  aspirin EC 81 MG tablet Take 81 mg by mouth daily.    [provider]  hydrochlorothiazide (HYDRODIURIL) 25 MG tablet Take 25 mg by mouth daily.    [provider]  lisinopril (PRINIVIL,ZESTRIL) 10 MG tablet Take 10 mg by mouth daily.    [provider]  lurasidone  (LATUDA) 20 MG TABS tablet Take 20 mg by mouth.    [provider]  methocarbamol (ROBAXIN) 500 MG tablet Take 1 tablet (500 mg total) by mouth 2 (two) times daily. 02/06/16   Barrett HenleNadeau, Nicole Elizabeth, PA-C  naproxen (NAPROSYN) 500 MG tablet Take 1 tablet (500 mg total) by mouth 2 (two) times daily. 02/06/16   Barrett HenleNadeau, Nicole Elizabeth, PA-C  omeprazole (PRILOSEC) 20 MG capsule Take 1 capsule (20 mg total) by mouth 2 (two) times daily before a meal. 05/14/16   Rolan BuccoBelfi, Melanie, MD  ondansetron (ZOFRAN ODT) 4 MG disintegrating tablet 4mg  ODT q4 hours prn nausea/vomit 05/14/16   Rolan BuccoBelfi, Melanie, MD  paliperidone (INVEGA) 6 MG 24 hr tablet Take 12 mg by mouth daily.    [provider]  potassium chloride (K-DUR) 10 MEQ tablet Take 1 tablet (10 mEq total) by mouth 2 (two) times daily. Take your medication for the next 3 days until you have your blood rechecked by your family doctor. 02/06/16   Barrett HenleNadeau, Nicole Elizabeth, PA-C  sertraline (ZOLOFT) 50 MG tablet Take 50 mg by mouth daily.    [provider]    Family History No family history on file.  Social History Social History   Tobacco Use  . Smoking status: Current Every Day Smoker  Packs/day: 1.00    Types: Cigarettes  . Smokeless tobacco: Never Used  Substance Use Topics  . Alcohol use: No  . Drug use: No     Allergies   Penicillins   Review of Systems Review of Systems  Constitutional: Negative for fever.  Respiratory: Negative for shortness of breath.   Cardiovascular: Negative for chest pain.  Gastrointestinal: Positive for abdominal pain, diarrhea, nausea and vomiting. Negative for blood in stool.  Genitourinary: Negative for dysuria.  Musculoskeletal: Negative for back pain.  Neurological: Positive for syncope and light-headedness.  All other systems reviewed and are negative.    Physical Exam Updated Vital Signs BP (!) 109/58   Pulse 78   Temp 97.7 F (36.5 C) (Oral)   Resp (!) 25   Ht 5'  2" (1.575 m)   Wt 61.2 kg   SpO2 100%   BMI 24.69 kg/m   Physical Exam Vitals signs and nursing note reviewed.  Constitutional:      Appearance: She is well-developed.  HENT:     Head: Normocephalic and atraumatic.     Right Ear: External ear normal.     Left Ear: External ear normal.     Nose: Nose normal.  Eyes:     General:        Right eye: No discharge.        Left eye: No discharge.  Cardiovascular:     Rate and Rhythm: Regular rhythm. Tachycardia present.     Heart sounds: Normal heart sounds.  Pulmonary:     Effort: Pulmonary effort is normal.     Breath sounds: Normal breath sounds.  Abdominal:     Palpations: Abdomen is soft.     Tenderness: There is abdominal tenderness in the right lower quadrant and left lower quadrant. There is no right CVA tenderness or left CVA tenderness.  Skin:    General: Skin is warm and dry.     Findings: No rash.  Neurological:     Mental Status: She is alert.  Psychiatric:        Mood and Affect: Mood is not anxious.      ED Treatments / Results  Labs (all labs ordered are listed, but only abnormal results are displayed) Labs Reviewed  COMPREHENSIVE METABOLIC PANEL - Abnormal; Notable for the following components:      Result Value   Sodium 134 (*)    CO2 20 (*)    Glucose, Bld 182 (*)    BUN 54 (*)    Creatinine, Ser 6.64 (*)    Calcium 10.4 (*)    GFR calc non Af Amer 6 (*)    GFR calc Af Amer 7 (*)    All other components within normal limits  CBC WITH DIFFERENTIAL/PLATELET - Abnormal; Notable for the following components:   WBC 10.7 (*)    MCV 103.1 (*)    MCH 35.4 (*)    Neutro Abs 7.8 (*)    All other components within normal limits  URINALYSIS, ROUTINE W REFLEX MICROSCOPIC - Abnormal; Notable for the following components:   Hgb urine dipstick TRACE (*)    Leukocytes, UA TRACE (*)    All other components within normal limits  URINALYSIS, MICROSCOPIC (REFLEX) - Abnormal; Notable for the following components:     Bacteria, UA RARE (*)    All other components within normal limits  C DIFFICILE QUICK SCREEN W PCR REFLEX  GASTROINTESTINAL PANEL BY PCR, STOOL (REPLACES STOOL CULTURE)  LIPASE, BLOOD  EKG EKG Interpretation  Date/Time:  Saturday September 21 2018 11:06:55 EST Ventricular Rate:  77 PR Interval:    QRS Duration: 76 QT Interval:  376 QTC Calculation: 426 R Axis:   85 Text Interpretation:  Sinus rhythm Consider right atrial enlargement Consider right ventricular hypertrophy Artifact no significant change since 2017 Confirmed by Pricilla LovelessGoldston, Melbert Botelho [phone]([id number]) on 09/21/2018 11:10:57 AM   Radiology Ct Abdomen Pelvis Wo Contrast  Result Date: 09/21/2018 CLINICAL DATA:  Abdominal pain and vomiting EXAM: CT ABDOMEN AND PELVIS WITHOUT CONTRAST TECHNIQUE: Multidetector CT imaging of the abdomen and pelvis was performed following the standard protocol without oral or IV contrast. COMPARISON:  None. FINDINGS: Lower chest: There is patchy infiltrate in the lateral segment of the left lower lobe. There is mild bibasilar atelectasis as well. Hepatobiliary: No focal liver lesions are appreciable on this noncontrast enhanced study. Gallbladder wall is not appreciably thickened. There is no biliary duct dilatation. Pancreas: There is no pancreatic mass or inflammatory focus. Spleen: No splenic lesions are evident. Adrenals/Urinary Tract: Right adrenal appears normal. There is mild left adrenal hypertrophy. Kidneys bilaterally show no evident mass or hydronephrosis on either side. There is no appreciable renal or ureteral calculus on either side. Urinary bladder is midline with wall thickness within normal limits. Stomach/Bowel: There is no appreciable bowel wall or mesenteric thickening. There is no appreciable bowel obstruction. There is no free air or portal venous air. No evident diverticulitis. Vascular/Lymphatic: There is aortoiliac atherosclerosis. No aneurysm evident. There is no appreciable calcification in  major arterial vessels. There is no adenopathy evident in the abdomen or pelvis. Reproductive: The uterus is mildly retropositioned. No pelvic mass is demonstrable. Other: Appendix appears normal. There is no abscess or ascites in the abdomen or pelvis. Musculoskeletal: There is degenerative change in the lower thoracic and lumbar regions. There are no blastic or lytic bone lesions. No intramuscular or abdominal wall lesions are evident. IMPRESSION: 1. Apparent focus of pneumonia in the lateral segment left lower lobe. 2. No evident bowel obstruction. No abscess in the abdomen or pelvis. Appendix appears normal. No evident diverticulitis. 3. No evident renal or ureteral calculus. No hydronephrosis on either side. 4.  Aortoiliac atherosclerosis. 5. Mild left adrenal hypertrophy, a finding of questionable clinical significance. Electronically Signed   By: Bretta BangWilliam  Woodruff III M.D.   On: 09/21/2018 12:44    Procedures Procedures (including critical care time)  Medications Ordered in ED Medications  sodium chloride 0.9 % bolus 1,000 mL (0 mLs Intravenous Stopped 09/21/18 1212)  morphine 4 MG/ML injection 4 mg (4 mg Intravenous Given 09/21/18 1108)  ondansetron (ZOFRAN) injection 4 mg (4 mg Intravenous Given 09/21/18 1108)  lactated ringers bolus 1,000 mL ( Intravenous Stopped 09/21/18 1324)     Initial Impression / Assessment and Plan / ED Course  I have reviewed the triage vital signs and the nursing notes.  Pertinent labs & imaging results that were available during my care of the patient were reviewed by me and considered in my medical decision making (see chart for details).     Patient was given IV fluids, morphine, and Zofran.  She is feeling much better.  She was transiently hypotensive but this has resolved.  Her hypotension is likely related to severe dehydration from the vomiting and diarrhea.  She denies any cough or shortness of breath and so I do not think she has pneumonia.  CT without other  acute abnormalities such as diverticulitis or obstruction.  She does not need emergent dialysis  but will need to go to [facility] and likely need a nephrology consult.  Continue IV fluids and I discussed with Dr. Willette Pa, who will admit.  Final Clinical Impressions(s) / ED Diagnoses   Final diagnoses:  Acute kidney injury [facility])    ED Discharge Orders    None       Pricilla Loveless, MD 09/21/18 1431

## 2018-09-21 NOTE — ED Notes (Signed)
Pt unable to void at this time. 

## 2018-09-21 NOTE — Progress Notes (Signed)
This is a 64 year old female who presented to med [facility] with low blood pressure of 81/71 now improved to 102/59.  She is had 1 week of nausea, vomiting, and diarrhea.  8 days ago she was seen at [facility] and received a flu shot and antibiotics for asymptomatic bacteriuria.  She does not know if she grew anything out on culture.  From her description it appears that she was given Bactrim DS.  Today in the emergency department her creatinine is 6.5.  Last creatinine we have is 1.5 in 2017.  She also had a transient rash on Thursday that has now improved.  CT scan of the abdomen and pelvis was obtained and there is a question of a left lower lobe infiltrate but the patient is clinically fine with no symptoms.  There is no evidence for cause of her mild abdominal pain which was why the CT scan was obtained.  Emergency department is starting IV fluids and has sent a GI pathogen panel.  Patient will require nephrology evaluation at some point during the hospitalization.

## 2018-09-21 NOTE — ED Notes (Signed)
ED Provider at bedside. 

## 2018-09-21 NOTE — ED Notes (Signed)
Per radiology protocol, awaiting CMP results prior to performing CT scan. 

## 2018-09-22 DIAGNOSIS — F319 Bipolar disorder, unspecified: Secondary | ICD-10-CM | POA: Diagnosis not present

## 2018-09-22 DIAGNOSIS — N171 Acute kidney failure with acute cortical necrosis: Secondary | ICD-10-CM | POA: Diagnosis not present

## 2018-09-22 DIAGNOSIS — N179 Acute kidney failure, unspecified: Secondary | ICD-10-CM

## 2018-09-22 DIAGNOSIS — K219 Gastro-esophageal reflux disease without esophagitis: Secondary | ICD-10-CM

## 2018-09-22 DIAGNOSIS — I10 Essential (primary) hypertension: Secondary | ICD-10-CM

## 2018-09-22 LAB — BASIC METABOLIC PANEL
Anion gap: 10 (ref 5–15)
BUN: 30 mg/dL — ABNORMAL HIGH (ref 8–23)
CO2: 22 mmol/L (ref 22–32)
Calcium: 9 mg/dL (ref 8.9–10.3)
Chloride: 107 mmol/L (ref 98–111)
Creatinine, Ser: 2.3 mg/dL — ABNORMAL HIGH (ref 0.44–1.00)
GFR calc Af Amer: 26 mL/min — ABNORMAL LOW (ref >60)
GFR calc non Af Amer: 22 mL/min — ABNORMAL LOW (ref >60)
Glucose, Bld: 97 mg/dL (ref 70–99)
Potassium: 4.2 mmol/L (ref 3.5–5.1)
SODIUM: 139 mmol/L (ref 135–145)

## 2018-09-22 LAB — CBC
HCT: 29 % — ABNORMAL LOW (ref 36.0–46.0)
Hemoglobin: 9.9 g/dL — ABNORMAL LOW (ref 12.0–15.0)
MCH: 35.7 pg — ABNORMAL HIGH (ref 26.0–34.0)
MCHC: 34.1 g/dL (ref 30.0–36.0)
MCV: 104.7 fL — ABNORMAL HIGH (ref 80.0–100.0)
Platelets: 125 10*3/uL — ABNORMAL LOW (ref 150–400)
RBC: 2.77 MIL/uL — ABNORMAL LOW (ref 3.87–5.11)
RDW: 11.9 % (ref 11.5–15.5)
WBC: 7 10*3/uL (ref 4.0–10.5)
nRBC: 0 % (ref 0.0–0.2)

## 2018-09-22 LAB — C DIFFICILE QUICK SCREEN W PCR REFLEX
C Diff antigen: NEGATIVE
C Diff interpretation: NOT DETECTED
C Diff toxin: NEGATIVE

## 2018-09-22 LAB — HIV ANTIBODY (ROUTINE TESTING W REFLEX): HIV SCREEN 4TH GENERATION: NONREACTIVE

## 2018-09-22 LAB — GLUCOSE, CAPILLARY: Glucose-Capillary: 87 mg/dL (ref 70–99)

## 2018-09-22 MED ORDER — LURASIDONE HCL 20 MG PO TABS
20.0000 mg | ORAL_TABLET | Freq: Every day | ORAL | Status: DC | PRN
  Filled 2018-09-22: qty 1

## 2018-09-22 MED ORDER — ASPIRIN EC 81 MG PO TBEC
81.0000 mg | DELAYED_RELEASE_TABLET | Freq: Every day | ORAL | Status: DC
  Administered 2018-09-22 – 2018-09-23 (×2): 81 mg via ORAL
  Filled 2018-09-22 (×2): qty 1

## 2018-09-22 MED ORDER — SERTRALINE HCL 50 MG PO TABS
50.0000 mg | ORAL_TABLET | Freq: Every day | ORAL | Status: DC
  Administered 2018-09-22 – 2018-09-23 (×2): 50 mg via ORAL
  Filled 2018-09-22 (×2): qty 1

## 2018-09-22 MED ORDER — ATORVASTATIN CALCIUM 80 MG PO TABS
80.0000 mg | ORAL_TABLET | Freq: Every day | ORAL | Status: DC
  Administered 2018-09-22 – 2018-09-23 (×2): 80 mg via ORAL
  Filled 2018-09-22 (×2): qty 1

## 2018-09-22 NOTE — Plan of Care (Signed)

## 2018-09-22 NOTE — Plan of Care (Signed)
  Problem: Pain Managment: Goal: General experience of comfort will improve Outcome: Progressing   Problem: Safety: Goal: Ability to remain free from injury will improve Outcome: Progressing   Problem: Skin Integrity: Goal: Risk for impaired skin integrity will decrease Outcome: Progressing   

## 2018-09-22 NOTE — Progress Notes (Signed)
[facility] PROGRESS NOTE  Sharon FlatterySarah Moses AOZ:[id number] DOB: 1956/03/18 DOA: 09/21/2018 [facility]  Assessment/Plan: 1. AKI, prerenal etiology related to multifactorial components (dehydration, Bactrim use, continue home use of NSAIDs, lisinopril and HCTZ) creatinine is continuing to improve with IV fluids, patient having normal urinary output.  Will discontinue IV fluids this afternoon and encourage oral hydration.appetite has returned to normal.  Will repeat BMP in a.m. to be able to go home if continues to improve with close follow-up with PCP.  Will list Bactrim as allergy.  Will hold on resuming home HCTZ and lisinopril may need to wait until outpatient determined by PCP. 2. Nausea, vomiting, diarrhea.  Doubt this was an infectious process as it all seems to have stemmed from use of Bactrim for asymptomatic bacteriuria.  All have resolved with discontinuation of Bactrim.  C. difficile and TIP pending however patient has not had any diarrhea.  Lipase within normal limits. 3. Asymptomatic bacteriuria.  Was treated with Bactrim by PCP.  Patient denies any dysuria or other urinary symptoms. 4. Anxiety/bipolar disorder?.  Patient states she takes Clozaril as PRN basis will resume here. 5. Hyperlipidemia, stable.  Continue home Lipitor.  Code Status: Full code Family Communication: No family at bedside  Disposition Plan: We will repeat BMP in a.m. and if continues to downtrend patient should be able to go home this approach she is able to continue improvement with oral hydration alone.   Consultants:  None  Procedures:  None  Antibiotics:  None   HPI/Subjective:  Sharon FlatterySarah Moses is a 63 y.o. year old female with medical history significant for hypertension, anxiety/bipolar?,  Hyperlipidemia who presented on 09/21/2018 with AKI and was found to have creatinine of 6.5 in the setting of acute dehydration/volume loss from emesis in the setting of Bactrim use for asymptomatic  bacteriuria.  She was prescribed Bactrim by her PCP approximately 1 week prior to admission.  At that time she was told she had a urinary tract infection.patient denied any urinary symptoms.  Shortly after starting Bactrim she reported multiple episodes of vomiting for 3 days followed by abdominal pain watery bowel movements.  She denied any cough, fevers, chills.  The office outpatient continue to take her home lisinopril, HCTZ and ibuprofen 2 tabs daily in the a.m. Patient was [facility] to [facility] due to concern for nephrology consultation and potential dialysis given elevated creatinine of 6.65.  With IV fluids alone and discontinuation of Bactrim and her other potential nephrotoxic agents her creatinine has improved to 2.3 on 2/9.  This a.m. she denies any abdominal pain no chest pain.  Denies any nausea or vomiting.  Reports improvement in appetite   Objective: Vitals:   09/21/18 2229 09/22/18 0434  BP: 102/62 (!) 114/55  Pulse: 79 88  Resp: 19 20  Temp: 99.1 F (37.3 C) 99.1 F (37.3 C)  SpO2: 99% 100%    Intake/Output Summary (Last 24 hours) at 09/22/2018 0753 Last data filed at 09/22/2018 0615 Gross per 24 hour  Intake 3821.34 ml  Output 800 ml  Net 3021.34 ml   Filed Weights   09/21/18 1045 09/21/18 1617 09/22/18 0624  Weight: 61.2 kg 65.4 kg 66.4 kg    Exam:   General: Lying in bed in no distress,  Cardiovascular: Regular rate and rhythm, no appreciable murmurs rubs or gallops, no edema  Respiratory: Respiratory effort on room air no crackles or wheezing  Abdomen: Soft, nondistended, nontender, normal bowel sounds  Musculoskeletal: Normal range of motion  Skin no skin rashes or lesions  Neurologic alert oriented x4, no appreciable focal deficits  Data Reviewed: Basic Metabolic Panel: Recent Labs  Lab 09/21/18 1103 09/21/18 1644 09/22/18 0456  NA 134*  --  139  K 4.1  --  4.2  CL 99  --  107  CO2 20*  --  22  GLUCOSE 182*  --  97  BUN 54*  --   30*  CREATININE 6.64* 4.56* 2.30*  CALCIUM 10.4*  --  9.0   Liver Function Tests: Recent Labs  Lab 09/21/18 1103  AST 26  ALT 20  ALKPHOS 50  BILITOT 0.7  PROT 7.7  ALBUMIN 4.6   Recent Labs  Lab 09/21/18 1103  LIPASE 20   No results for input(s): AMMONIA in the last 168 hours. CBC: Recent Labs  Lab 09/21/18 1103 09/21/18 1644 09/22/18 0456  WBC 10.7* 8.2 7.0  NEUTROABS 7.8*  --   --   HGB 14.8 12.0 9.9*  HCT 43.1 35.2* 29.0*  MCV 103.1* 101.7* 104.7*  PLT 213 158 125*   Cardiac Enzymes: No results for input(s): CKTOTAL, CKMB, CKMBINDEX, TROPONINI in the last 168 hours. BNP (last 3 results) No results for input(s): BNP in the last 8760 hours.  ProBNP (last 3 results) No results for input(s): PROBNP in the last 8760 hours.  CBG: No results for input(s): GLUCAP in the last 168 hours.  No results found for this or any previous visit (from the past 240 hour(s)).   Studies: Ct Abdomen Pelvis Wo Contrast  Result Date: 09/21/2018 CLINICAL DATA:  Abdominal pain and vomiting EXAM: CT ABDOMEN AND PELVIS WITHOUT CONTRAST TECHNIQUE: Multidetector CT imaging of the abdomen and pelvis was performed following the standard protocol without oral or IV contrast. COMPARISON:  None. FINDINGS: Lower chest: There is patchy infiltrate in the lateral segment of the left lower lobe. There is mild bibasilar atelectasis as well. Hepatobiliary: No focal liver lesions are appreciable on this noncontrast enhanced study. Gallbladder wall is not appreciably thickened. There is no biliary duct dilatation. Pancreas: There is no pancreatic mass or inflammatory focus. Spleen: No splenic lesions are evident. Adrenals/Urinary Tract: Right adrenal appears normal. There is mild left adrenal hypertrophy. Kidneys bilaterally show no evident mass or hydronephrosis on either side. There is no appreciable renal or ureteral calculus on either side. Urinary bladder is midline with wall thickness within normal  limits. Stomach/Bowel: There is no appreciable bowel wall or mesenteric thickening. There is no appreciable bowel obstruction. There is no free air or portal venous air. No evident diverticulitis. Vascular/Lymphatic: There is aortoiliac atherosclerosis. No aneurysm evident. There is no appreciable calcification in major arterial vessels. There is no adenopathy evident in the abdomen or pelvis. Reproductive: The uterus is mildly retropositioned. No pelvic mass is demonstrable. Other: Appendix appears normal. There is no abscess or ascites in the abdomen or pelvis. Musculoskeletal: There is degenerative change in the lower thoracic and lumbar regions. There are no blastic or lytic bone lesions. No intramuscular or abdominal wall lesions are evident. IMPRESSION: 1. Apparent focus of pneumonia in the lateral segment left lower lobe. 2. No evident bowel obstruction. No abscess in the abdomen or pelvis. Appendix appears normal. No evident diverticulitis. 3. No evident renal or ureteral calculus. No hydronephrosis on either side. 4.  Aortoiliac atherosclerosis. 5. Mild left adrenal hypertrophy, a finding of questionable clinical significance. Electronically Signed   By: Bretta Bang III M.D.   On: 09/21/2018 12:44    Scheduled Meds: .  heparin  5,000 Units Subcutaneous Q8H  . sodium chloride flush  3 mL Intravenous Q12H   Continuous Infusions: . sodium chloride 125 mL/hr at 09/22/18 0025  . sodium chloride      Principal Problem:   Acute renal failure (ARF) (HCC) Active Problems:   Bipolar depression (HCC)   Essential (primary) hypertension   GERD (gastroesophageal reflux disease)      Elder Cyphers Nettey  [facility]

## 2018-09-23 DIAGNOSIS — R197 Diarrhea, unspecified: Secondary | ICD-10-CM

## 2018-09-23 DIAGNOSIS — K219 Gastro-esophageal reflux disease without esophagitis: Secondary | ICD-10-CM | POA: Diagnosis not present

## 2018-09-23 DIAGNOSIS — N179 Acute kidney failure, unspecified: Secondary | ICD-10-CM | POA: Diagnosis not present

## 2018-09-23 DIAGNOSIS — I10 Essential (primary) hypertension: Secondary | ICD-10-CM | POA: Diagnosis not present

## 2018-09-23 DIAGNOSIS — R112 Nausea with vomiting, unspecified: Secondary | ICD-10-CM

## 2018-09-23 DIAGNOSIS — F319 Bipolar disorder, unspecified: Secondary | ICD-10-CM | POA: Diagnosis not present

## 2018-09-23 LAB — GASTROINTESTINAL PANEL BY PCR, STOOL (REPLACES STOOL CULTURE)

## 2018-09-23 LAB — BASIC METABOLIC PANEL
Anion gap: 10 (ref 5–15)
BUN: 11 mg/dL (ref 8–23)
CO2: 21 mmol/L — ABNORMAL LOW (ref 22–32)
Calcium: 9.4 mg/dL (ref 8.9–10.3)
Chloride: 106 mmol/L (ref 98–111)
Creatinine, Ser: 1.07 mg/dL — ABNORMAL HIGH (ref 0.44–1.00)
GFR calc Af Amer: >60 mL/min (ref >60)
GFR calc non Af Amer: 56 mL/min — ABNORMAL LOW (ref >60)
Glucose, Bld: 101 mg/dL — ABNORMAL HIGH (ref 70–99)
Potassium: 4 mmol/L (ref 3.5–5.1)
Sodium: 137 mmol/L (ref 135–145)

## 2018-09-23 LAB — GLUCOSE, CAPILLARY: Glucose-Capillary: 89 mg/dL (ref 70–99)

## 2018-09-23 MED ORDER — HYDROCHLOROTHIAZIDE 25 MG PO TABS: ORAL_TABLET | ORAL | Status: AC

## 2018-09-23 MED ORDER — LISINOPRIL 10 MG PO TABS: ORAL_TABLET | ORAL | Status: AC

## 2018-09-23 NOTE — Discharge Summary (Signed)
Discharge Summary  Sharon Moses ZOX:[id number] DOB: March 30, 1956  PCP: [facility]  Admit date: 09/21/2018 Discharge date: 09/23/2018   Time spent: < 25 minutes  Admitted From: home Disposition:  home  Recommendations for Outpatient Follow-up:  1. Follow up with PCP in 1 week, f/u BMP 2. Hold home lisinopril and HCTZ until seen by PCP 3. Encouraged no NSAID use 4. Added Sulfa allergy    Discharge Diagnoses:  Active Hospital Problems   Diagnosis Date Noted  . Acute renal failure (ARF) (HCC) 09/21/2018  . Bipolar depression (HCC) 09/21/2018  . Essential (primary) hypertension 09/21/2018  . GERD (gastroesophageal reflux disease) 09/21/2018    Resolved Hospital Problems  No resolved problems to display.    Discharge Condition: Stable   CODE STATUS:FULL   History of present illness:  Sharon Moses is a 63 y.o. year old female with medical history significant for hypertension, anxiety/bipolar?,  Hyperlipidemia who presented on 09/21/2018 with AKI and was found to have creatinine of 6.5 in the setting of acute dehydration/volume loss from emesis in the setting of Bactrim use for asymptomatic bacteriuria.  She was prescribed Bactrim by her PCP approximately 1 week prior to admission.  At that time she believes she was told she had a urinary tract infection. She denies any previous dysuria, urgency, frequency or malodor or other urinary symptoms.  Shortly after starting Bactrim she reported multiple episodes of vomiting for 3 days followed by abdominal pain watery bowel movements.  She denied any cough, fevers, chills. During that time she continued to take her home lisinopril, HCTZ and ibuprofen 2 tabs daily in the a.m. She also had 2 episodes of syncope while trying to using the bathroom.   She was evaluated at [place] and found to have creatinine elevated at 6.65 prompting transfer to  [facility] due to concern for nephrology .Marland Kitchen Remaining hospital course addressed in  problem based format below:   Hospital Course:   1. AKI, prerenal etiology related to multifactorial components, resolved.  Patient presented with elevated creatinine of 6.65 which improved within 48 hours back to her normal baseline of 1.07 with IV fluids x 24 hours while holding her home lisinopril, HCTZ and discontinuing Bactrim.  Based off quick resolution it is very likely patient had acute kidney dysfunction related to dehydration from nausea/vomiting and diarrhea, presume this is secondary to allergic reaction from Bactrim which is now listed as an allergy.  It was likely further exacerbated by patient's concomitant use of lisinopril and HCTZ during her acute illness.  Patient hass been instructed to hold on further use of lisinopril HCTZ until PCP check of BMP.  On discharge patient creatinine 1.07, having normal urine output, diarrhea/vomiting and nausea has resolved.   2. Nausea, vomiting, diarrhea, resolved.  Doubt this was an infectious process as it all seems to have stemmed from use of Bactrim for asymptomatic bacteriuria.  All have resolved with discontinuation of Bactrim.  C. difficile PCR is negative.  GI pathogen panel was pending on discharge.   Lipase within normal limits.  3. Asymptomatic bacteriuria.  Was treated with Bactrim.  Patient denies any previous dysuria, urgency or frequency or other urinary symptoms.  UA here showed trace leukocytes only.  No treatment needed  4. Anxiety/bipolar disorder?.  Patient states she takes [name] as PRN basis will resume here. 5.  6. Hyperlipidemia, stable.  Continue home Lipitor.   Consultations:  none  Procedures/Studies: none  Discharge Exam: BP (!) 149/72 (BP Location: Right Arm)  Pulse 69   Temp 98.9 F (37.2 C) (Oral)   Resp 18   Ht 5\' 2"  (1.575 m)   Wt 64.9 kg   SpO2 98%   BMI 26.17 kg/m   General: Lying in bed, no apparent distress Eyes: EOMI, anicteric ENT: Oral Mucosa clear and moist Cardiovascular: regular  rate and rhythm, no murmurs, rubs or gallops, no edema, Respiratory: Normal respiratory effort on room air, lungs clear to auscultation bilaterally Abdomen: soft, non-distended, non-tender, normal bowel sounds Skin: No Rash Neurologic: Grossly no focal neuro deficit.Mental status AAOx3, speech normal, Psychiatric:Appropriate affect, and mood   Discharge Instructions You were cared for by a hospitalist during your hospital stay. If you have any questions about your discharge medications or the care you received while you were in the hospital after you are discharged, you can call the unit and asked to speak with the hospitalist on call if the hospitalist that took care of you is not available. Once you are discharged, your primary care physician will handle any further medical issues. Please note that NO REFILLS for any discharge medications will be authorized once you are discharged, as it is imperative that you return to your primary care physician (or establish a relationship with a primary care physician if you do not have one) for your aftercare needs so that they can reassess your need for medications and monitor your lab values.  Discharge Instructions    Diet - low sodium heart healthy   Complete by:  As directed    Increase activity slowly   Complete by:  As directed      Allergies as of 09/23/2018      Reactions   Penicillins Itching   Sulfa Antibiotics Itching      Medication List    STOP taking these medications   methocarbamol 500 MG tablet Commonly known as:  ROBAXIN   naproxen 500 MG tablet Commonly known as:  NAPROSYN   omeprazole 20 MG capsule Commonly known as:  PRILOSEC   ondansetron 4 MG disintegrating tablet Commonly known as:  ZOFRAN ODT   potassium chloride 10 MEQ tablet Commonly known as:  K-DUR     TAKE these medications   aspirin EC 81 MG tablet Take 81 mg by mouth daily.   atorvastatin 80 MG tablet Commonly known as:  LIPITOR Take 80 mg by  mouth daily.   hydrochlorothiazide 25 MG tablet Commonly known as:  HYDRODIURIL HOLD UNTIL SEEN BY PRIMARY CARE DOCTOR What changed:    how much to take  how to take this  when to take this  additional instructions   LATUDA 20 MG Tabs tablet Generic drug:  lurasidone Take 20 mg by mouth.   lisinopril 10 MG tablet Commonly known as:  PRINIVIL,ZESTRIL HOLD until seen by PCP What changed:    how much to take  how to take this  when to take this  additional instructions   sertraline 50 MG tablet Commonly known as:  ZOLOFT Take 50 mg by mouth daily.      Allergies  Allergen Reactions  . Penicillins Itching  . Sulfa Antibiotics Itching      The results of significant diagnostics from this hospitalization (including imaging, microbiology, ancillary and laboratory) are listed below for reference.    Significant Diagnostic Studies: Ct Abdomen Pelvis Wo Contrast  Result Date: 09/21/2018 CLINICAL DATA:  Abdominal pain and vomiting EXAM: CT ABDOMEN AND PELVIS WITHOUT CONTRAST TECHNIQUE: Multidetector CT imaging of the abdomen and pelvis was performed  following the standard protocol without oral or IV contrast. COMPARISON:  None. FINDINGS: Lower chest: There is patchy infiltrate in the lateral segment of the left lower lobe. There is mild bibasilar atelectasis as well. Hepatobiliary: No focal liver lesions are appreciable on this noncontrast enhanced study. Gallbladder wall is not appreciably thickened. There is no biliary duct dilatation. Pancreas: There is no pancreatic mass or inflammatory focus. Spleen: No splenic lesions are evident. Adrenals/Urinary Tract: Right adrenal appears normal. There is mild left adrenal hypertrophy. Kidneys bilaterally show no evident mass or hydronephrosis on either side. There is no appreciable renal or ureteral calculus on either side. Urinary bladder is midline with wall thickness within normal limits. Stomach/Bowel: There is no appreciable  bowel wall or mesenteric thickening. There is no appreciable bowel obstruction. There is no free air or portal venous air. No evident diverticulitis. Vascular/Lymphatic: There is aortoiliac atherosclerosis. No aneurysm evident. There is no appreciable calcification in major arterial vessels. There is no adenopathy evident in the abdomen or pelvis. Reproductive: The uterus is mildly retropositioned. No pelvic mass is demonstrable. Other: Appendix appears normal. There is no abscess or ascites in the abdomen or pelvis. Musculoskeletal: There is degenerative change in the lower thoracic and lumbar regions. There are no blastic or lytic bone lesions. No intramuscular or abdominal wall lesions are evident. IMPRESSION: 1. Apparent focus of pneumonia in the lateral segment left lower lobe. 2. No evident bowel obstruction. No abscess in the abdomen or pelvis. Appendix appears normal. No evident diverticulitis. 3. No evident renal or ureteral calculus. No hydronephrosis on either side. 4.  Aortoiliac atherosclerosis. 5. Mild left adrenal hypertrophy, a finding of questionable clinical significance. Electronically Signed   By: Bretta Bang III M.D.   On: 09/21/2018 12:44    Microbiology: Recent Results (from the past 240 hour(s))  C Difficile Quick Screen w PCR reflex     Status: None   Collection Time: 09/22/18  1:40 PM  Result Value Ref Range Status   C Diff antigen NEGATIVE NEGATIVE Final   C Diff toxin NEGATIVE NEGATIVE Final   C Diff interpretation No C. difficile detected.  Final    Comment: Performed at [facility], [address] [place], [place] [address]     Labs: Basic Metabolic Panel: Recent Labs  Lab 09/21/18 1103 09/21/18 1644 09/22/18 0456 09/23/18 0711  NA 134*  --  139 137  K 4.1  --  4.2 4.0  CL 99  --  107 106  CO2 20*  --  22 21*  GLUCOSE 182*  --  97 101*  BUN 54*  --  30* 11  CREATININE 6.64* 4.56* 2.30* 1.07*  CALCIUM 10.4*  --  9.0 9.4   Liver Function  Tests: Recent Labs  Lab 09/21/18 1103  AST 26  ALT 20  ALKPHOS 50  BILITOT 0.7  PROT 7.7  ALBUMIN 4.6   Recent Labs  Lab 09/21/18 1103  LIPASE 20   No results for input(s): AMMONIA in the last 168 hours. CBC: Recent Labs  Lab 09/21/18 1103 09/21/18 1644 09/22/18 0456  WBC 10.7* 8.2 7.0  NEUTROABS 7.8*  --   --   HGB 14.8 12.0 9.9*  HCT 43.1 35.2* 29.0*  MCV 103.1* 101.7* 104.7*  PLT 213 158 125*   Cardiac Enzymes: No results for input(s): CKTOTAL, CKMB, CKMBINDEX, TROPONINI in the last 168 hours. BNP: BNP (last 3 results) No results for input(s): BNP in the last 8760 hours.  ProBNP (last 3  results) No results for input(s): PROBNP in the last 8760 hours.  CBG: Recent Labs  Lab 09/22/18 0752 09/23/18 0815  GLUCAP 87 89       Signed:  Laverna Peace, MD [facility] 09/23/2018, 8:34 AM

## 2018-09-23 NOTE — Progress Notes (Signed)
Discharged patient, alert, oriented. Discharge instructions given and explained. 

## 2018-09-23 NOTE — Care Management Obs Status (Signed)
MEDICARE OBSERVATION STATUS NOTIFICATION   Patient Details  Name: Sharon Moses MRN: [id number] Date of Birth: Aug 02, 1956   Medicare Observation Status Notification Given:       Kingsley Plan, RN 09/23/2018, 9:59 AM

## 2020-04-26 ENCOUNTER — Emergency Department (HOSPITAL_BASED_OUTPATIENT_CLINIC_OR_DEPARTMENT_OTHER)
Admission: EM | Admit: 2020-04-26 | Discharge: 2020-04-26 | Disposition: A | Discharge status: Home / Self Care | Payer: Medicare HMO | Attending: Emergency Medicine | Admitting: Emergency Medicine

## 2020-04-26 ENCOUNTER — Encounter (HOSPITAL_BASED_OUTPATIENT_CLINIC_OR_DEPARTMENT_OTHER): Payer: Self-pay | Admitting: *Deleted

## 2020-04-26 ENCOUNTER — Other Ambulatory Visit: Payer: Self-pay

## 2020-04-26 ENCOUNTER — Emergency Department (HOSPITAL_BASED_OUTPATIENT_CLINIC_OR_DEPARTMENT_OTHER): Payer: Medicare HMO

## 2020-04-26 DIAGNOSIS — I10 Essential (primary) hypertension: Secondary | ICD-10-CM | POA: Insufficient documentation

## 2020-04-26 DIAGNOSIS — Z7982 Long term (current) use of aspirin: Secondary | ICD-10-CM | POA: Insufficient documentation

## 2020-04-26 DIAGNOSIS — E119 Type 2 diabetes mellitus without complications: Secondary | ICD-10-CM | POA: Insufficient documentation

## 2020-04-26 DIAGNOSIS — Y939 Activity, unspecified: Secondary | ICD-10-CM | POA: Insufficient documentation

## 2020-04-26 DIAGNOSIS — Z79899 Other long term (current) drug therapy: Secondary | ICD-10-CM | POA: Insufficient documentation

## 2020-04-26 DIAGNOSIS — M79604 Pain in right leg: Secondary | ICD-10-CM | POA: Insufficient documentation

## 2020-04-26 DIAGNOSIS — Y929 Unspecified place or not applicable: Secondary | ICD-10-CM | POA: Diagnosis not present

## 2020-04-26 DIAGNOSIS — M7918 Myalgia, other site: Secondary | ICD-10-CM

## 2020-04-26 DIAGNOSIS — F17210 Nicotine dependence, cigarettes, uncomplicated: Secondary | ICD-10-CM | POA: Insufficient documentation

## 2020-04-26 DIAGNOSIS — Y999 Unspecified external cause status: Secondary | ICD-10-CM | POA: Insufficient documentation

## 2020-04-26 MED ORDER — ACETAMINOPHEN 325 MG PO TABS
650.0000 mg | ORAL_TABLET | Freq: Once | ORAL | Status: AC
  Administered 2020-04-26: 650 mg via ORAL
  Filled 2020-04-26: qty 2

## 2020-04-26 NOTE — ED Notes (Signed)
Patient transported to X-ray 
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
## 2020-04-26 NOTE — Discharge Instructions (Signed)
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
## 2020-12-22 ENCOUNTER — Emergency Department (HOSPITAL_BASED_OUTPATIENT_CLINIC_OR_DEPARTMENT_OTHER)
Admission: EM | Admit: 2020-12-22 | Discharge: 2020-12-22 | Disposition: A | Discharge status: Home / Self Care | Payer: Medicare HMO | Attending: Emergency Medicine | Admitting: Emergency Medicine

## 2020-12-22 ENCOUNTER — Other Ambulatory Visit: Payer: Self-pay

## 2020-12-22 ENCOUNTER — Encounter (HOSPITAL_BASED_OUTPATIENT_CLINIC_OR_DEPARTMENT_OTHER): Payer: Self-pay | Admitting: *Deleted

## 2020-12-22 DIAGNOSIS — Z7982 Long term (current) use of aspirin: Secondary | ICD-10-CM | POA: Diagnosis not present

## 2020-12-22 DIAGNOSIS — Z79899 Other long term (current) drug therapy: Secondary | ICD-10-CM | POA: Diagnosis not present

## 2020-12-22 DIAGNOSIS — R519 Headache, unspecified: Secondary | ICD-10-CM | POA: Diagnosis not present

## 2020-12-22 DIAGNOSIS — A084 Viral intestinal infection, unspecified: Secondary | ICD-10-CM | POA: Diagnosis not present

## 2020-12-22 DIAGNOSIS — I10 Essential (primary) hypertension: Secondary | ICD-10-CM | POA: Insufficient documentation

## 2020-12-22 DIAGNOSIS — E119 Type 2 diabetes mellitus without complications: Secondary | ICD-10-CM | POA: Insufficient documentation

## 2020-12-22 DIAGNOSIS — R5383 Other fatigue: Secondary | ICD-10-CM | POA: Diagnosis not present

## 2020-12-22 DIAGNOSIS — F17210 Nicotine dependence, cigarettes, uncomplicated: Secondary | ICD-10-CM | POA: Diagnosis not present

## 2020-12-22 DIAGNOSIS — Z20822 Contact with and (suspected) exposure to covid-19: Secondary | ICD-10-CM | POA: Diagnosis not present

## 2020-12-22 DIAGNOSIS — R112 Nausea with vomiting, unspecified: Secondary | ICD-10-CM | POA: Diagnosis present

## 2020-12-22 DIAGNOSIS — Z8616 Personal history of COVID-19: Secondary | ICD-10-CM | POA: Insufficient documentation

## 2020-12-22 HISTORY — DX: Alcohol abuse, uncomplicated: F10.10

## 2020-12-22 LAB — CBC WITH DIFFERENTIAL/PLATELET
Abs Immature Granulocytes: 0.03 10*3/uL (ref 0.00–0.07)
Basophils Absolute: 0 10*3/uL (ref 0.0–0.1)
Basophils Relative: 0 %
Eosinophils Absolute: 0.1 10*3/uL (ref 0.0–0.5)
Eosinophils Relative: 2 %
HCT: 43.4 % (ref 36.0–46.0)
Hemoglobin: 15 g/dL (ref 12.0–15.0)
Immature Granulocytes: 1 %
Lymphocytes Relative: 9 %
Lymphs Abs: 0.5 10*3/uL — ABNORMAL LOW (ref 0.7–4.0)
MCH: 36.6 pg — ABNORMAL HIGH (ref 26.0–34.0)
MCHC: 34.6 g/dL (ref 30.0–36.0)
MCV: 105.9 fL — ABNORMAL HIGH (ref 80.0–100.0)
Monocytes Absolute: 0.4 10*3/uL (ref 0.1–1.0)
Monocytes Relative: 7 %
Neutro Abs: 4.3 10*3/uL (ref 1.7–7.7)
Neutrophils Relative %: 81 %
Platelets: 124 10*3/uL — ABNORMAL LOW (ref 150–400)
RBC: 4.1 MIL/uL (ref 3.87–5.11)
RDW: 12.1 % (ref 11.5–15.5)
WBC: 5.2 10*3/uL (ref 4.0–10.5)
nRBC: 0 % (ref 0.0–0.2)

## 2020-12-22 LAB — COMPREHENSIVE METABOLIC PANEL
ALT: 15 U/L (ref 0–44)
AST: 34 U/L (ref 15–41)
Albumin: 4.7 g/dL (ref 3.5–5.0)
Alkaline Phosphatase: 57 U/L (ref 38–126)
Anion gap: 11 (ref 5–15)
BUN: 14 mg/dL (ref 8–23)
CO2: 23 mmol/L (ref 22–32)
Calcium: 10.2 mg/dL (ref 8.9–10.3)
Chloride: 101 mmol/L (ref 98–111)
Creatinine, Ser: 0.81 mg/dL (ref 0.44–1.00)
GFR, Estimated: >60 mL/min (ref >60)
Glucose, Bld: 100 mg/dL — ABNORMAL HIGH (ref 70–99)
Potassium: 3.3 mmol/L — ABNORMAL LOW (ref 3.5–5.1)
Sodium: 135 mmol/L (ref 135–145)
Total Bilirubin: 1.4 mg/dL — ABNORMAL HIGH (ref 0.3–1.2)
Total Protein: 8.1 g/dL (ref 6.5–8.1)

## 2020-12-22 LAB — RESP PANEL BY RT-PCR (FLU A&B, COVID) ARPGX2
Influenza A by PCR: NEGATIVE
Influenza B by PCR: NEGATIVE
SARS Coronavirus 2 by RT PCR: NEGATIVE

## 2020-12-22 LAB — LIPASE, BLOOD: Lipase: 25 U/L (ref 11–51)

## 2020-12-22 MED ORDER — ONDANSETRON HCL 4 MG/2ML IJ SOLN
4.0000 mg | Freq: Once | INTRAMUSCULAR | Status: AC
  Administered 2020-12-22: 4 mg via INTRAVENOUS
  Filled 2020-12-22: qty 2

## 2020-12-22 MED ORDER — ONDANSETRON 4 MG PO TBDP: 4.0000 mg | ORAL_TABLET | Freq: Three times a day (TID) | ORAL | 0 refills | Status: AC | PRN

## 2020-12-22 MED ORDER — SODIUM CHLORIDE 0.9 % IV BOLUS
1000.0000 mL | Freq: Once | INTRAVENOUS | Status: AC
  Administered 2020-12-22: 1000 mL via INTRAVENOUS

## 2020-12-22 MED ORDER — ACETAMINOPHEN 325 MG PO TABS
650.0000 mg | ORAL_TABLET | Freq: Once | ORAL | Status: AC
  Administered 2020-12-22: 650 mg via ORAL
  Filled 2020-12-22: qty 2

## 2020-12-22 MED ORDER — POTASSIUM CHLORIDE CRYS ER 20 MEQ PO TBCR
40.0000 meq | EXTENDED_RELEASE_TABLET | Freq: Once | ORAL | Status: AC
  Administered 2020-12-22: 40 meq via ORAL
  Filled 2020-12-22: qty 2

## 2020-12-22 NOTE — ED Provider Notes (Signed)
[facility] EMERGENCY DEPARTMENT Provider Note   CSN: [id number] Arrival date & time: 12/22/20  1801     History Chief Complaint  Patient presents with  . Vomiting    Sharon Moses is a 65 y.o. female.  HPI 65 year old with a medical history as noted below.  She presents to the emergency department due to fatigue.  She states it started about 3 days ago.  She started feeling tired with intermittent headaches, nausea, vomiting, as well as diarrhea.  She reports little p.o. intake for the past 2 days due to her symptoms.  She states that she has had repetitive nausea and diarrhea causing her to feel weak.  She reports associated lightheadedness.  No chest pain, shortness of breath, abdominal pain, urinary complaints, URI symptoms.  Patient has been vaccinated for COVID-19 x3.  Also reports a previous COVID-19 infection.    Past Medical History:  Diagnosis Date  . Anemia   . Bipolar 1 disorder (HCC)   . Diabetes mellitus without complication (HCC)   . ETOH abuse   . Hypercholesteremia   . Hypertension   . Osteoporosis     Patient Active Problem List   Diagnosis Date Noted  . AKI (acute kidney injury) (HCC) 09/23/2018  . Acute renal failure (ARF) (HCC) 09/21/2018  . Bipolar depression (HCC) 09/21/2018  . Essential (primary) hypertension 09/21/2018  . GERD (gastroesophageal reflux disease) 09/21/2018    History reviewed. No pertinent surgical history.   OB History   No obstetric history on file.     No family history on file.  Social History   Tobacco Use  . Smoking status: Current Every Day Smoker    Packs/day: 1.00    Types: Cigarettes  . Smokeless tobacco: Never Used  Substance Use Topics  . Alcohol use: No  . Drug use: No    Home Medications Prior to Admission medications   Medication Sig Start Date End Date Taking? Authorizing Provider  ondansetron (ZOFRAN ODT) 4 MG disintegrating tablet Take 1 tablet (4 mg total) by mouth every 8 (eight) hours  as needed for nausea or vomiting. 12/22/20  Yes Placido Sou, PA-C  aspirin EC 81 MG tablet Take 81 mg by mouth daily.    [provider]  atorvastatin (LIPITOR) 80 MG tablet Take 80 mg by mouth daily. 05/03/18   [provider]  hydrochlorothiazide (HYDRODIURIL) 25 MG tablet HOLD UNTIL SEEN BY PRIMARY CARE DOCTOR 09/23/18   Roberto Scales D, MD  lisinopril (PRINIVIL,ZESTRIL) 10 MG tablet HOLD until seen by PCP 09/23/18   Laverna Peace, MD  lurasidone (LATUDA) 20 MG TABS tablet Take 20 mg by mouth.    [provider]  sertraline (ZOLOFT) 50 MG tablet Take 50 mg by mouth daily.    [provider]    Allergies    Penicillins and Sulfa antibiotics  Review of Systems   Review of Systems  All other systems reviewed and are negative. Ten systems reviewed and are negative for acute change, except as noted in the HPI.    Physical Exam Updated Vital Signs BP (!) 147/77 (BP Location: Right Arm)   Pulse 90   Temp 99.9 F (37.7 C) (Oral)   Resp 16   Ht 5\' 2"  (1.575 m)   Wt 61.2 kg   SpO2 100%   BMI 24.69 kg/m   Physical Exam Vitals and nursing note reviewed.  Constitutional:      General: She is not in acute distress.    Appearance:  Normal appearance. She is normal weight. She is not ill-appearing, toxic-appearing or diaphoretic.  HENT:     Head: Normocephalic and atraumatic.     Right Ear: External ear normal.     Left Ear: External ear normal.     Nose: Nose normal.     Mouth/Throat:     Mouth: Mucous membranes are moist.     Pharynx: Oropharynx is clear. No oropharyngeal exudate or posterior oropharyngeal erythema.  Eyes:     General: No scleral icterus.       Right eye: No discharge.        Left eye: No discharge.     Extraocular Movements: Extraocular movements intact.     Conjunctiva/sclera: Conjunctivae normal.  Cardiovascular:     Rate and Rhythm: Normal rate and regular rhythm.     Pulses: Normal pulses.     Heart sounds: Normal  heart sounds. No murmur heard. No friction rub. No gallop.      Comments: Regular rate and rhythm without murmurs, rubs, or gallops. Pulmonary:     Effort: Pulmonary effort is normal. No respiratory distress.     Breath sounds: Normal breath sounds. No stridor. No wheezing, rhonchi or rales.     Comments: Lungs are clear to auscultation bilaterally.  No wheezing, rales, or rhonchi. Abdominal:     General: Abdomen is flat.     Palpations: Abdomen is soft.     Tenderness: There is no abdominal tenderness.     Comments: Abdomen is flat, soft, and nontender.  Musculoskeletal:        General: Normal range of motion.     Cervical back: Normal range of motion and neck supple. No tenderness.     Comments: No pedal edema.  Skin:    General: Skin is warm and dry.  Neurological:     General: No focal deficit present.     Mental Status: She is alert and oriented to person, place, and time.  Psychiatric:        Mood and Affect: Mood normal.        Behavior: Behavior normal.    ED Results / Procedures / Treatments   Labs (all labs ordered are listed, but only abnormal results are displayed) Labs Reviewed  COMPREHENSIVE METABOLIC PANEL - Abnormal; Notable for the following components:      Result Value   Potassium 3.3 (*)    Glucose, Bld 100 (*)    Total Bilirubin 1.4 (*)    All other components within normal limits  CBC WITH DIFFERENTIAL/PLATELET - Abnormal; Notable for the following components:   MCV 105.9 (*)    MCH 36.6 (*)    Platelets 124 (*)    Lymphs Abs 0.5 (*)    All other components within normal limits  RESP PANEL BY RT-PCR (FLU A&B, COVID) ARPGX2  LIPASE, BLOOD    EKG None  Radiology No results found.  Procedures Procedures   Medications Ordered in ED Medications  sodium chloride 0.9 % bolus 1,000 mL (0 mLs Intravenous Stopped 12/22/20 2000)  ondansetron (ZOFRAN) injection 4 mg (4 mg Intravenous Given 12/22/20 1844)  acetaminophen (TYLENOL) tablet 650 mg (650 mg  Oral Given 12/22/20 1843)  potassium chloride SA (KLOR-CON) CR tablet 40 mEq (40 mEq Oral Given 12/22/20 1927)    ED Course  I have reviewed the triage vital signs and the nursing notes.  Pertinent labs & imaging results that were available during my care of the patient were reviewed by me and considered  in my medical decision making (see chart for details).    MDM Rules/Calculators/A&P                          Pt is a 65 y.o. female who presents to the emergency department due to fatigue, nausea, vomiting, as well as diarrhea.  Labs: CBC showing an elevated MCV of 105.9, platelets of 124, lymphocytes of 0.5. CMP with a potassium of 3.3, glucose of 100, total bilirubin of 1.4.  Potassium repleted with Klor-Con.  Likely due to repetitive nausea and vomiting. Lipase of 25. Respiratory panel is negative.  I, Placido Sou, PA-C, personally reviewed and evaluated these images and lab results as part of my medical decision-making.  Patient appears to have a viral gastroenteritis.  Respiratory panel was negative.  She was started on IV fluids, Zofran, and given a dose of Tylenol.  She reports significant improvement of her symptoms.  She was p.o. challenged successfully.  Physical exam today was extremely reassuring.  Lungs are clear to auscultation bilaterally.  Heart is regular rate and rhythm.  Abdomen is soft and nontender.  No urinary complaints.  Nontoxic-appearing.  Feel that patient is stable for discharge at this time and she is agreeable.  Given strict return precautions.  Will discharge on a short course of Zofran for breakthrough nausea and vomiting.  Her questions were answered and she was amicable at the time of discharge.  Note: Portions of this report may have been transcribed using voice recognition software. Every effort was made to ensure accuracy; however, inadvertent computerized transcription errors may be present.    Final Clinical Impression(s) / ED Diagnoses Final  diagnoses:  Viral gastroenteritis    Rx / DC Orders ED Discharge Orders         Ordered    ondansetron (ZOFRAN ODT) 4 MG disintegrating tablet  Every 8 hours PRN        12/22/20 2015           Placido Sou, PA-C 12/22/20 2021    Maia Plan, MD 12/27/20 [phone]

## 2020-12-22 NOTE — ED Triage Notes (Signed)
C/o n/v/d , h/a dizzy , body aches x days

## 2020-12-22 NOTE — Discharge Instructions (Signed)
I prescribed you medication of Zofran.  This is a medication you can take up to 3 times a day for severe nausea and vomiting.  Only take this if you are experiencing nausea and vomiting that you cannot control.  Please continue to monitor your symptoms closely.  If you develop any new or worsening symptoms such as fevers, severe abdominal pain, chest pain, shortness of breath, nausea/vomiting you cannot control, please return to the emergency department for immediate reevaluation.  It was a pleasure to meet you.

## 2021-02-19 ENCOUNTER — Emergency Department (HOSPITAL_BASED_OUTPATIENT_CLINIC_OR_DEPARTMENT_OTHER)
Admission: EM | Admit: 2021-02-19 | Discharge: 2021-02-19 | Disposition: A | Discharge status: Home / Self Care | Payer: Medicare HMO | Attending: Emergency Medicine | Admitting: Emergency Medicine

## 2021-02-19 ENCOUNTER — Encounter (HOSPITAL_BASED_OUTPATIENT_CLINIC_OR_DEPARTMENT_OTHER): Payer: Self-pay

## 2021-02-19 ENCOUNTER — Other Ambulatory Visit: Payer: Self-pay

## 2021-02-19 DIAGNOSIS — R059 Cough, unspecified: Secondary | ICD-10-CM | POA: Diagnosis present

## 2021-02-19 DIAGNOSIS — Z7982 Long term (current) use of aspirin: Secondary | ICD-10-CM | POA: Diagnosis not present

## 2021-02-19 DIAGNOSIS — Z79899 Other long term (current) drug therapy: Secondary | ICD-10-CM | POA: Insufficient documentation

## 2021-02-19 DIAGNOSIS — E1169 Type 2 diabetes mellitus with other specified complication: Secondary | ICD-10-CM | POA: Diagnosis not present

## 2021-02-19 DIAGNOSIS — F17210 Nicotine dependence, cigarettes, uncomplicated: Secondary | ICD-10-CM | POA: Insufficient documentation

## 2021-02-19 DIAGNOSIS — U071 COVID-19: Secondary | ICD-10-CM | POA: Diagnosis not present

## 2021-02-19 DIAGNOSIS — I10 Essential (primary) hypertension: Secondary | ICD-10-CM | POA: Insufficient documentation

## 2021-02-19 DIAGNOSIS — E7800 Pure hypercholesterolemia, unspecified: Secondary | ICD-10-CM | POA: Insufficient documentation

## 2021-02-19 MED ORDER — NIRMATRELVIR/RITONAVIR (PAXLOVID)TABLET: 3.0000 | ORAL_TABLET | Freq: Two times a day (BID) | ORAL | 0 refills | Status: AC

## 2021-02-19 MED ORDER — BENZONATATE 100 MG PO CAPS: 100.0000 mg | ORAL_CAPSULE | Freq: Three times a day (TID) | ORAL | 0 refills | Status: AC

## 2021-02-19 MED ORDER — ONDANSETRON 4 MG PO TBDP: 4.0000 mg | ORAL_TABLET | Freq: Three times a day (TID) | ORAL | 0 refills | Status: AC | PRN

## 2021-02-19 MED ORDER — BENZONATATE 100 MG PO CAPS
100.0000 mg | ORAL_CAPSULE | Freq: Once | ORAL | Status: AC
  Administered 2021-02-19: 100 mg via ORAL
  Filled 2021-02-19: qty 1

## 2021-02-19 NOTE — Discharge Instructions (Addendum)
It was a pleasure taking care of you today.  As discussed, your symptoms are most likely related to COVID-19 infection.  I am sending you home with COVID treatment.  Take as prescribed.  I am also sending you home with symptomatic treatment.  Take as needed.  Continue to quarantine for 7 days since symptom onset.  Follow-up with PCP if symptoms not improved within the next week.  Return to the ER for any worsening symptoms.  PLEASE HOLD YOUR LATUDA AND LIPITOR WHILE YOU ARE ON PAXLOVID and restart the medications right when you are finished with the medication.

## 2021-02-19 NOTE — ED Provider Notes (Signed)
[facility] EMERGENCY DEPARTMENT Provider Note   CSN: [id number] Arrival date & time: 02/19/21  1257     History Chief Complaint  Patient presents with   Fatigue   Nasal Congestion    COVID +    Sharon Moses is a 65 y.o. female with a past medical history significant for bipolar 1 disorder, diabetes, alcohol abuse, hyperlipidemia, and hypertension who presents to the ED due to cough, diarrhea, generalized weakness, nasal congestion, and fatigue x2 days.  Patient tested positive for COVID-19 today at the health department.  Patient denies shortness of breath and chest pain.  She is currently vaccinated against COVID-19 including her booster shot.  Patient has been taking Tylenol with moderate relief. She admits to a few episodes of nonbloody diarrhea.  Denies nausea and vomiting. No treatment prior to arrival.  No aggravating or alleviating symptoms.  History obtained from patient and past medical records. No interpreter used during encounter.       Past Medical History:  Diagnosis Date   Anemia    Bipolar 1 disorder (HCC)    Diabetes mellitus without complication (HCC)    ETOH abuse    Hypercholesteremia    Hypertension    Osteoporosis     Patient Active Problem List   Diagnosis Date Noted   AKI (acute kidney injury) (HCC) 09/23/2018   Acute renal failure (ARF) (HCC) 09/21/2018   Bipolar depression (HCC) 09/21/2018   Essential (primary) hypertension 09/21/2018   GERD (gastroesophageal reflux disease) 09/21/2018    History reviewed. No pertinent surgical history.   OB History   No obstetric history on file.     History reviewed. No pertinent family history.  Social History   Tobacco Use   Smoking status: Every Day    Packs/day: 1.00    Pack years: 0.00    Types: Cigarettes   Smokeless tobacco: Never  Substance Use Topics   Alcohol use: No   Drug use: No    Home Medications Prior to Admission medications   Medication Sig Start Date End Date  Taking? Authorizing Provider  benzonatate (TESSALON) 100 MG capsule Take 1 capsule (100 mg total) by mouth every 8 (eight) hours. 02/19/21  Yes Rasheeda Mulvehill, Merla Riches, PA-C  nirmatrelvir/ritonavir EUA (PAXLOVID) TABS Take 3 tablets by mouth 2 (two) times daily for 5 days. Patient GFR is >60 Take nirmatrelvir (150 mg) two tablets twice daily for 5 days and ritonavir (100 mg) one tablet twice daily for 5 days. 02/19/21 02/24/21 Yes Faustina Gebert C, PA-C  ondansetron (ZOFRAN ODT) 4 MG disintegrating tablet Take 1 tablet (4 mg total) by mouth every 8 (eight) hours as needed for nausea or vomiting. 02/19/21  Yes Mannie Stabile, PA-C  aspirin EC 81 MG tablet Take 81 mg by mouth daily.    [provider]  atorvastatin (LIPITOR) 80 MG tablet Take 80 mg by mouth daily. 05/03/18   [provider]  hydrochlorothiazide (HYDRODIURIL) 25 MG tablet HOLD UNTIL SEEN BY PRIMARY CARE DOCTOR 09/23/18   Roberto Scales D, MD  lisinopril (PRINIVIL,ZESTRIL) 10 MG tablet HOLD until seen by PCP 09/23/18   Laverna Peace, MD  lurasidone (LATUDA) 20 MG TABS tablet Take 20 mg by mouth.    [provider]  ondansetron (ZOFRAN ODT) 4 MG disintegrating tablet Take 1 tablet (4 mg total) by mouth every 8 (eight) hours as needed for nausea or vomiting. 12/22/20   Placido Sou, PA-C  sertraline (ZOLOFT) 50 MG tablet Take 50 mg by mouth  daily.    [provider]    Allergies    Penicillins and Sulfa antibiotics  Review of Systems   Review of Systems  Constitutional:  Positive for chills and fatigue. Negative for fever.  Respiratory:  Positive for cough. Negative for shortness of breath.   Cardiovascular:  Negative for chest pain.  Gastrointestinal:  Positive for diarrhea. Negative for abdominal pain, nausea and vomiting.  Neurological:  Negative for dizziness and headaches.  All other systems reviewed and are negative.  Physical Exam Updated Vital Signs BP 117/90 (BP Location: Right Arm)    Pulse 95   Temp 98.4 F (36.9 C) (Oral)   Resp 18   Ht 5\' 2"  (1.575 m)   Wt 61.2 kg   SpO2 97%   BMI 24.69 kg/m   Physical Exam Vitals and nursing note reviewed.  Constitutional:      General: She is not in acute distress.    Appearance: She is not ill-appearing.  HENT:     Head: Normocephalic.  Eyes:     Pupils: Pupils are equal, round, and reactive to light.  Cardiovascular:     Rate and Rhythm: Normal rate and regular rhythm.     Pulses: Normal pulses.     Heart sounds: Normal heart sounds. No murmur heard.   No friction rub. No gallop.  Pulmonary:     Effort: Pulmonary effort is normal.     Breath sounds: Normal breath sounds.     Comments: Respirations equal and unlabored, patient able to speak in full sentences, lungs clear to auscultation bilaterally Abdominal:     General: Abdomen is flat. There is no distension.     Palpations: Abdomen is soft.     Tenderness: There is no abdominal tenderness. There is no guarding or rebound.  Musculoskeletal:        General: Normal range of motion.     Cervical back: Neck supple.  Skin:    General: Skin is warm and dry.  Neurological:     General: No focal deficit present.     Mental Status: She is alert.  Psychiatric:        Mood and Affect: Mood normal.        Behavior: Behavior normal.    ED Results / Procedures / Treatments   Labs (all labs ordered are listed, but only abnormal results are displayed) Labs Reviewed - No data to display  EKG None  Radiology No results found.  Procedures Procedures   Medications Ordered in ED Medications  benzonatate (TESSALON) capsule 100 mg (has no administration in time range)    ED Course  I have reviewed the triage vital signs and the nursing notes.  Pertinent labs & imaging results that were available during my care of the patient were reviewed by me and considered in my medical decision making (see chart for details).    MDM Rules/Calculators/A&P                          65 year old female presents to the ED due to generalized weakness, fatigue, cough, diarrhea, nasal congestion x2 days.  Patient tested positive for COVID today at the health department.  Patient denies chest pain and shortness of breath.  She is currently vaccinated against COVID-19 including her booster shot.  Upon arrival, vitals all within normal limits.  Patient is afebrile, not tachycardic or hypoxic.  Patient nontoxic-appearing.  Physical exam reassuring.  Lungs clear to auscultation bilaterally.  Low suspicion for pneumonia.  Abdomen soft, nondistended, nontender.  No meningismus to suggest meningitis.  Suspect symptoms related to COVID-19 infection.  Given patient's risk factors, will prescribe Paxlovid and symptomatic treatment. Discussed Paxlovid with John from  pharmacy who recommends holding Latuda and Lipitor while patient was on  Paxlovid. Patient aware. Strict ED precautions discussed with patient. Patient states understanding and agrees to plan. Patient discharged home in no acute distress and stable vitals  Sharon Moses was evaluated in Emergency Department on 02/19/2021 for the symptoms described in the history of present illness. She was evaluated in the context of the global COVID-19 pandemic, which necessitated consideration that the patient might be at risk for infection with the SARS-CoV-2 virus that causes COVID-19. Institutional protocols and algorithms that pertain to the evaluation of patients at risk for COVID-19 are in a state of rapid change based on information released by regulatory bodies including the CDC and federal and state organizations. These policies and algorithms were followed during the patient's care in the ED.  Final Clinical Impression(s) / ED Diagnoses Final diagnoses:  COVID-19 virus infection    Rx / DC Orders ED Discharge Orders          Ordered    nirmatrelvir/ritonavir EUA (PAXLOVID) TABS  2 times daily        02/19/21 1429    benzonatate  (TESSALON) 100 MG capsule  Every 8 hours        02/19/21 1429    ondansetron (ZOFRAN ODT) 4 MG disintegrating tablet  Every 8 hours PRN        02/19/21 1429             Mannie Stabile, PA-C 02/19/21 1441    Rolan Bucco, MD 02/20/21 1501

## 2021-02-19 NOTE — ED Triage Notes (Signed)
Pt tested positive for COVID today. Symptoms started Thursday with cough, diarrhea, weakness, congestion, sneezing. NAD. Denies CP/SOB
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
## 2023-07-05 ENCOUNTER — Other Ambulatory Visit: Payer: Self-pay

## 2023-07-05 ENCOUNTER — Emergency Department (HOSPITAL_BASED_OUTPATIENT_CLINIC_OR_DEPARTMENT_OTHER)
Admission: EM | Admit: 2023-07-05 | Discharge: 2023-07-05 | Disposition: A | Discharge status: Home / Self Care | Payer: Medicare HMO | Attending: Emergency Medicine | Admitting: Emergency Medicine

## 2023-07-05 ENCOUNTER — Encounter (HOSPITAL_BASED_OUTPATIENT_CLINIC_OR_DEPARTMENT_OTHER): Payer: Self-pay | Admitting: Emergency Medicine

## 2023-07-05 ENCOUNTER — Emergency Department (HOSPITAL_BASED_OUTPATIENT_CLINIC_OR_DEPARTMENT_OTHER): Payer: Medicare HMO

## 2023-07-05 DIAGNOSIS — Z7982 Long term (current) use of aspirin: Secondary | ICD-10-CM | POA: Insufficient documentation

## 2023-07-05 DIAGNOSIS — Z79899 Other long term (current) drug therapy: Secondary | ICD-10-CM | POA: Diagnosis not present

## 2023-07-05 DIAGNOSIS — K862 Cyst of pancreas: Secondary | ICD-10-CM | POA: Diagnosis not present

## 2023-07-05 DIAGNOSIS — N390 Urinary tract infection, site not specified: Secondary | ICD-10-CM | POA: Insufficient documentation

## 2023-07-05 DIAGNOSIS — R1030 Lower abdominal pain, unspecified: Secondary | ICD-10-CM | POA: Diagnosis present

## 2023-07-05 LAB — BASIC METABOLIC PANEL
Anion gap: 7 (ref 5–15)
BUN: 20 mg/dL (ref 8–23)
CO2: 23 mmol/L (ref 22–32)
Calcium: 10.1 mg/dL (ref 8.9–10.3)
Chloride: 108 mmol/L (ref 98–111)
Creatinine, Ser: 1 mg/dL (ref 0.44–1.00)
GFR, Estimated: >60 mL/min (ref >60)
Glucose, Bld: 93 mg/dL (ref 70–99)
Potassium: 4.6 mmol/L (ref 3.5–5.1)
Sodium: 138 mmol/L (ref 135–145)

## 2023-07-05 LAB — URINALYSIS, W/ REFLEX TO CULTURE (INFECTION SUSPECTED)
Bilirubin Urine: NEGATIVE
Glucose, UA: NEGATIVE mg/dL
Hgb urine dipstick: NEGATIVE
Ketones, ur: NEGATIVE mg/dL
Nitrite: POSITIVE — AB
Protein, ur: NEGATIVE mg/dL
Specific Gravity, Urine: 1.02 (ref 1.005–1.030)
pH: 5 (ref 5.0–8.0)

## 2023-07-05 LAB — CBC WITH DIFFERENTIAL/PLATELET
Abs Immature Granulocytes: 0.01 10*3/uL (ref 0.00–0.07)
Basophils Absolute: 0 10*3/uL (ref 0.0–0.1)
Basophils Relative: 1 %
Eosinophils Absolute: 0.3 10*3/uL (ref 0.0–0.5)
Eosinophils Relative: 5 %
HCT: 32 % — ABNORMAL LOW (ref 36.0–46.0)
Hemoglobin: 10.5 g/dL — ABNORMAL LOW (ref 12.0–15.0)
Immature Granulocytes: 0 %
Lymphocytes Relative: 42 %
Lymphs Abs: 2.4 10*3/uL (ref 0.7–4.0)
MCH: 34.2 pg — ABNORMAL HIGH (ref 26.0–34.0)
MCHC: 32.8 g/dL (ref 30.0–36.0)
MCV: 104.2 fL — ABNORMAL HIGH (ref 80.0–100.0)
Monocytes Absolute: 0.7 10*3/uL (ref 0.1–1.0)
Monocytes Relative: 12 %
Neutro Abs: 2.3 10*3/uL (ref 1.7–7.7)
Neutrophils Relative %: 40 %
Platelets: 170 10*3/uL (ref 150–400)
RBC: 3.07 MIL/uL — ABNORMAL LOW (ref 3.87–5.11)
RDW: 13.2 % (ref 11.5–15.5)
WBC: 5.7 10*3/uL (ref 4.0–10.5)
nRBC: 0 % (ref 0.0–0.2)

## 2023-07-05 MED ORDER — NITROFURANTOIN MONOHYD MACRO 100 MG PO CAPS: 100.0000 mg | ORAL_CAPSULE | Freq: Two times a day (BID) | ORAL | 0 refills | Status: AC

## 2023-07-05 MED ORDER — IOHEXOL 300 MG/ML  SOLN
100.0000 mL | Freq: Once | INTRAMUSCULAR | Status: AC | PRN
  Administered 2023-07-05: 100 mL via INTRAVENOUS

## 2023-07-05 NOTE — ED Provider Notes (Signed)
[facility] Provider Note   CSN: [id number] Arrival date & time: 07/05/23  4098     History  Chief Complaint  Patient presents with   Abdominal Pain    Sharon Moses is a 67 y.o. female.   Abdominal Pain Patient presents with abdominal pain.  Lower abdomen.  Has had for the last week.  Does have some urinary pressure.  No fevers.  States severity waxes and wanes but is always present.  Has not had pains like this before.  No weight loss.  No diarrhea but states sometimes had some difficulty having a bowel movement.     Home Medications Prior to Admission medications   Medication Sig Start Date End Date Taking? Authorizing Provider  nitrofurantoin, macrocrystal-monohydrate, (MACROBID) 100 MG capsule Take 1 capsule (100 mg total) by mouth 2 (two) times daily. 07/05/23  Yes Benjiman Core, MD  aspirin EC 81 MG tablet Take 81 mg by mouth daily.    [provider]  atorvastatin (LIPITOR) 80 MG tablet Take 80 mg by mouth daily. 05/03/18   [provider]  benzonatate (TESSALON) 100 MG capsule Take 1 capsule (100 mg total) by mouth every 8 (eight) hours. 02/19/21   Mannie Stabile, PA-C  hydrochlorothiazide (HYDRODIURIL) 25 MG tablet HOLD UNTIL SEEN BY PRIMARY CARE DOCTOR 09/23/18   Roberto Scales D, MD  lisinopril (PRINIVIL,ZESTRIL) 10 MG tablet HOLD until seen by PCP 09/23/18   Roberto Scales D, MD  lurasidone (LATUDA) 20 MG TABS tablet Take 20 mg by mouth.    [provider]  ondansetron (ZOFRAN ODT) 4 MG disintegrating tablet Take 1 tablet (4 mg total) by mouth every 8 (eight) hours as needed for nausea or vomiting. 12/22/20   Placido Sou, PA-C  ondansetron (ZOFRAN ODT) 4 MG disintegrating tablet Take 1 tablet (4 mg total) by mouth every 8 (eight) hours as needed for nausea or vomiting. 02/19/21   Mannie Stabile, PA-C  sertraline (ZOLOFT) 50 MG tablet Take 50 mg by mouth daily.    [provider]       Allergies    Penicillins and Sulfa antibiotics    Review of Systems   Review of Systems  Gastrointestinal:  Positive for abdominal pain.    Physical Exam Updated Vital Signs BP 108/72   Pulse 67   Temp 99.2 F (37.3 C) (Oral)   Resp 18   Ht 5\' 2"  (1.575 m)   Wt 59 kg   SpO2 100%   BMI 23.78 kg/m  Physical Exam Vitals reviewed.  Cardiovascular:     Rate and Rhythm: Normal rate.  Abdominal:     Tenderness: There is abdominal tenderness.     Hernia: No hernia is present.     Comments: Mild lower abdominal tenderness without rebound or guarding.  No hernia palpated.  Neurological:     Mental Status: She is alert.     ED Results / Procedures / Treatments   Labs (all labs ordered are listed, but only abnormal results are displayed) Labs Reviewed  CBC WITH DIFFERENTIAL/PLATELET - Abnormal; Notable for the following components:      Result Value   RBC 3.07 (*)    Hemoglobin 10.5 (*)    HCT 32.0 (*)    MCV 104.2 (*)    MCH 34.2 (*)    All other components within normal limits  URINALYSIS, W/ REFLEX TO CULTURE (INFECTION SUSPECTED) - Abnormal; Notable for the following components:   Nitrite POSITIVE (*)  Leukocytes,Ua TRACE (*)    Bacteria, UA MANY (*)    All other components within normal limits  URINE CULTURE  BASIC METABOLIC PANEL    EKG None  Radiology CT ABDOMEN PELVIS W CONTRAST  Result Date: 07/05/2023 CLINICAL DATA:  67 year old female with left lower quadrant pain. Lower abdominal pain for 1 week. EXAM: CT ABDOMEN AND PELVIS WITH CONTRAST TECHNIQUE: Multidetector CT imaging of the abdomen and pelvis was performed using the standard protocol following bolus administration of intravenous contrast. RADIATION DOSE REDUCTION: This exam was performed according to the departmental dose-optimization program which includes automated exposure control, adjustment of the mA and/or kV according to patient size and/or use of iterative reconstruction technique.  CONTRAST:  OMNIPAQUE IOHEXOL 300 MG/ML  SOLN COMPARISON:  CT Abdomen and Pelvis 09/21/2018. FINDINGS: Lower chest: Negative.  No pericardial or pleural effusion. Hepatobiliary: Negative liver and gallbladder. Pancreas: Oval, circumscribed 23 x 14 by 15 mm (AP by transverse by CC) cystic mass at the head of the pancreas (coronal image 88, series 301, image 24). This was not apparent on the noncontrast CT in 2020. No main pancreatic ductal dilatation. No pancreatic body or tail atrophy. No inflammation or abnormal enhancement. Spleen: Negative. Adrenals/Urinary Tract: Normal adrenal glands. Nonobstructed kidneys. No nephrolithiasis. Symmetric renal enhancement. Symmetric renal contrast excretion. Decompressed ureters. Diminutive bladder. Stomach/Bowel: No dilated large or small bowel loops. Mild retained stool throughout the large bowel. Redundant transverse colon. Normal appendix series 301, image 55. No large bowel inflammation. Decompressed terminal ileum. Distal small bowel loops occasionally contain fluid in flocculated material but are nondilated. Stomach is decompressed. Duodenum is negative. No free air or free fluid. No mesenteric inflammation identified. Vascular/Lymphatic: Extensive Aortoiliac calcified atherosclerosis. Major arterial structures remain patent. Normal caliber abdominal aorta. No lymphadenopathy. Portal venous system is patent. Reproductive: Negative. Other: No pelvis free fluid. Musculoskeletal: Widespread vertebral endplate osteophytes. No acute or suspicious osseous lesion. IMPRESSION: 1. Positive for a 2.3 cm long axis circumscribed cystic mass of the pancreatic head, not apparent on noncontrast CT in 2020, and main pancreatic duct communication not determined. Recommend follow-up imaging q6 months x 4 (preferably contrast-enhanced Abdomen MRI or pancreas protocol CT) versus an Endoscopic Ultrasound, FNA. This recommendation follows ACR consensus guidelines: Management of Incidental  Pancreatic Cysts: A White Paper of the ACR Incidental Findings Committee. J Am Coll Radiol 2017;14:911-923. 2. No superimposed acute or inflammatory process identified in the abdomen or pelvis. 3. Advanced  Aortic Atherosclerosis (ICD10-I70.0). Electronically Signed   By: Odessa Fleming M.D.   On: 07/05/2023 11:37    Procedures Procedures    Medications Ordered in ED Medications  iohexol (OMNIPAQUE) 300 MG/ML solution 100 mL (100 mLs Intravenous Contrast Given 07/05/23 1031)    ED Course/ Medical Decision Making/ A&P                                 Medical Decision Making Amount and/or Complexity of Data Reviewed Labs: ordered. Radiology: ordered.  Risk Prescription drug management.  Patient lower abdominal pain.  Some dysuria.  Differential diagnose includes diverticulitis, UTI, nonspecific abdominal pain.  Will get basic blood work urinalysis and CT scan to evaluate.  Blood work reassuring.  CT scan shows potential pancreatic cyst versus mass.  Discussed with patient we will need follow-up.  Given follow-up with gastroenterology here.  Patient states that is hard to get into her doctor's.  Also has UTI.  Will treat with Macrobid  due to some allergies.  Good renal function.       Final Clinical Impression(s) / ED Diagnoses Final diagnoses:  Lower urinary tract infectious disease  Pancreatic cyst    Rx / DC Orders ED Discharge Orders          Ordered    nitrofurantoin, macrocrystal-monohydrate, (MACROBID) 100 MG capsule  2 times daily        07/05/23 1300              Benjiman Core, MD 07/05/23 1425

## 2023-07-05 NOTE — ED Notes (Signed)
Discharge instructions reviewed with patient. Patient verbalizes understanding, no further questions at this time. Medications/prescriptions and follow up information provided. No acute distress noted at time of departure.  

## 2023-07-05 NOTE — Discharge Instructions (Signed)
Follow-up with either your primary care doctor or gastroenterology for the further management of the cyst in your pancreas.

## 2023-07-05 NOTE — ED Triage Notes (Signed)
Lower abd pain x 1 week all across her abd  no n/v/d states last bM was yesterday normal , feels pressure when she pees  ,  denies vag d/c or odor

## 2023-07-07 LAB — URINE CULTURE: Culture: >=100000 — AB

## 2023-07-08 ENCOUNTER — Telehealth (HOSPITAL_BASED_OUTPATIENT_CLINIC_OR_DEPARTMENT_OTHER): Payer: Self-pay | Admitting: *Deleted

## 2023-07-08 NOTE — Telephone Encounter (Signed)
Post ED Visit - Positive Culture Follow-up  Culture report reviewed by antimicrobial stewardship pharmacist: [facility] []  Enzo Bi, Pharm.D. []  Celedonio Miyamoto, Pharm.D., BCPS AQ-ID []  Garvin Fila, Pharm.D., BCPS []  Georgina Pillion, Pharm.D., BCPS []  [place], [address].D., BCPS, AAHIVP []  Estella Husk, Pharm.D., BCPS, AAHIVP []  Lysle Pearl, PharmD, BCPS []  Phillips Climes, PharmD, BCPS []  Agapito Games, PharmD, BCPS []  Verlan Friends, PharmD []  Mervyn Gay, PharmD, BCPS [x]  Ivery Quale, PharmD  [facility] []  Len Childs, PharmD []  Greer Pickerel, PharmD []  Adalberto Cole, PharmD []  Perlie Gold, Rph []  Lonell Face) Jean Rosenthal, PharmD []  Earl Many, PharmD []  Junita Push, PharmD []  Dorna Leitz, PharmD []  Terrilee Files, PharmD []  Lynann Beaver, PharmD []  Keturah Barre, PharmD []  Loralee Pacas, PharmD []  Bernadene Person, PharmD   Positive urine culture Treated with Nitrofurantoin, organism sensitive to the same and no further patient follow-up is required at this time.  Patsey Berthold 07/08/2023, 7:50 AM

## 2024-03-13 ENCOUNTER — Emergency Department (HOSPITAL_BASED_OUTPATIENT_CLINIC_OR_DEPARTMENT_OTHER)

## 2024-03-13 ENCOUNTER — Encounter (HOSPITAL_BASED_OUTPATIENT_CLINIC_OR_DEPARTMENT_OTHER): Payer: Self-pay | Admitting: Emergency Medicine

## 2024-03-13 ENCOUNTER — Other Ambulatory Visit: Payer: Self-pay

## 2024-03-13 ENCOUNTER — Emergency Department (HOSPITAL_BASED_OUTPATIENT_CLINIC_OR_DEPARTMENT_OTHER)
Admission: EM | Admit: 2024-03-13 | Discharge: 2024-03-14 | Discharge status: Against Medical Advice | Attending: Emergency Medicine | Admitting: Emergency Medicine

## 2024-03-13 DIAGNOSIS — Z5321 Procedure and treatment not carried out due to patient leaving prior to being seen by health care provider: Secondary | ICD-10-CM | POA: Insufficient documentation

## 2024-03-13 DIAGNOSIS — M7918 Myalgia, other site: Secondary | ICD-10-CM | POA: Insufficient documentation

## 2024-03-13 LAB — CBC
HCT: 36.6 % (ref 36.0–46.0)
Hemoglobin: 12.4 g/dL (ref 12.0–15.0)
MCH: 35.1 pg — ABNORMAL HIGH (ref 26.0–34.0)
MCHC: 33.9 g/dL (ref 30.0–36.0)
MCV: 103.7 fL — ABNORMAL HIGH (ref 80.0–100.0)
Platelets: 162 K/uL (ref 150–400)
RBC: 3.53 MIL/uL — ABNORMAL LOW (ref 3.87–5.11)
RDW: 13.5 % (ref 11.5–15.5)
WBC: 7.3 K/uL (ref 4.0–10.5)
nRBC: 0 % (ref 0.0–0.2)

## 2024-03-13 LAB — BASIC METABOLIC PANEL WITH GFR
Anion gap: 12 (ref 5–15)
BUN: 21 mg/dL (ref 8–23)
CO2: 21 mmol/L — ABNORMAL LOW (ref 22–32)
Calcium: 10.8 mg/dL — ABNORMAL HIGH (ref 8.9–10.3)
Chloride: 107 mmol/L (ref 98–111)
Creatinine, Ser: 1.44 mg/dL — ABNORMAL HIGH (ref 0.44–1.00)
GFR, Estimated: 40 mL/min — ABNORMAL LOW (ref >60)
Glucose, Bld: 97 mg/dL (ref 70–99)
Potassium: 4.4 mmol/L (ref 3.5–5.1)
Sodium: 140 mmol/L (ref 135–145)

## 2024-03-13 LAB — TROPONIN T, HIGH SENSITIVITY: Troponin T High Sensitivity: <15 ng/L (ref <19)

## 2024-03-13 NOTE — ED Triage Notes (Signed)
 Pt POV c/o generalized body aches, worse in L arm x 2 months.  Pain is worse with movement. Denies known injury.
